# Patient Record
Sex: Male | Born: 1950 | Race: White | Hispanic: No | Marital: Married | Smoking: Former smoker
Health system: Southern US, Community
[De-identification: ages and names within clinical notes are randomized; demographics above are authoritative.]

## PROBLEM LIST (undated history)

## (undated) DIAGNOSIS — E785 Hyperlipidemia, unspecified: Secondary | ICD-10-CM

## (undated) DIAGNOSIS — I10 Essential (primary) hypertension: Secondary | ICD-10-CM

## (undated) DIAGNOSIS — I700 Atherosclerosis of aorta: Secondary | ICD-10-CM

## (undated) DIAGNOSIS — D126 Benign neoplasm of colon, unspecified: Secondary | ICD-10-CM

## (undated) DIAGNOSIS — R011 Cardiac murmur, unspecified: Secondary | ICD-10-CM

## (undated) DIAGNOSIS — F419 Anxiety disorder, unspecified: Secondary | ICD-10-CM

## (undated) DIAGNOSIS — Z860101 Personal history of adenomatous and serrated colon polyps: Secondary | ICD-10-CM

## (undated) DIAGNOSIS — E782 Mixed hyperlipidemia: Secondary | ICD-10-CM

## (undated) DIAGNOSIS — N200 Calculus of kidney: Secondary | ICD-10-CM

## (undated) DIAGNOSIS — F411 Generalized anxiety disorder: Secondary | ICD-10-CM

## (undated) DIAGNOSIS — I341 Nonrheumatic mitral (valve) prolapse: Secondary | ICD-10-CM

## (undated) DIAGNOSIS — M5126 Other intervertebral disc displacement, lumbar region: Secondary | ICD-10-CM

## (undated) DIAGNOSIS — K648 Other hemorrhoids: Secondary | ICD-10-CM

## (undated) DIAGNOSIS — M1990 Unspecified osteoarthritis, unspecified site: Secondary | ICD-10-CM

## (undated) DIAGNOSIS — I480 Paroxysmal atrial fibrillation: Secondary | ICD-10-CM

## (undated) DIAGNOSIS — K219 Gastro-esophageal reflux disease without esophagitis: Secondary | ICD-10-CM

## (undated) DIAGNOSIS — I4891 Unspecified atrial fibrillation: Secondary | ICD-10-CM

## (undated) DIAGNOSIS — Z86010 Personal history of colonic polyps: Secondary | ICD-10-CM

## (undated) HISTORY — PX: KNEE SURGERY: SHX244

## (undated) HISTORY — DX: Other hemorrhoids: K64.8

## (undated) HISTORY — DX: Cardiac murmur, unspecified: R01.1

## (undated) HISTORY — DX: Atherosclerosis of aorta: I70.0

## (undated) HISTORY — DX: Hyperlipidemia, unspecified: E78.5

## (undated) HISTORY — PX: COLONOSCOPY: SHX174

## (undated) HISTORY — DX: Gastro-esophageal reflux disease without esophagitis: K21.9

## (undated) HISTORY — DX: Anxiety disorder, unspecified: F41.9

## (undated) HISTORY — PX: CARDIAC CATHETERIZATION: SHX172

## (undated) HISTORY — DX: Nonrheumatic mitral (valve) prolapse: I34.1

## (undated) HISTORY — DX: Benign neoplasm of colon, unspecified: D12.6

---

## 1984-01-27 HISTORY — PX: KNEE ARTHROSCOPY: SUR90

## 1997-09-14 ENCOUNTER — Ambulatory Visit (HOSPITAL_COMMUNITY): Admission: RE | Admit: 1997-09-14 | Discharge: 1997-09-14 | Payer: Self-pay | Admitting: Cardiology

## 1998-01-26 HISTORY — PX: CARDIAC CATHETERIZATION: SHX172

## 1998-06-20 ENCOUNTER — Ambulatory Visit (HOSPITAL_COMMUNITY): Admission: RE | Admit: 1998-06-20 | Discharge: 1998-06-20 | Payer: Self-pay | Admitting: Neurology

## 1998-06-20 ENCOUNTER — Encounter: Payer: Self-pay | Admitting: Neurology

## 1999-12-17 ENCOUNTER — Ambulatory Visit (HOSPITAL_COMMUNITY): Admission: RE | Admit: 1999-12-17 | Discharge: 1999-12-17 | Payer: Self-pay | Admitting: Gastroenterology

## 1999-12-17 ENCOUNTER — Encounter: Payer: Self-pay | Admitting: Gastroenterology

## 2000-01-27 HISTORY — PX: CHOLECYSTECTOMY, LAPAROSCOPIC: SHX56

## 2000-02-11 ENCOUNTER — Ambulatory Visit (HOSPITAL_COMMUNITY): Admission: RE | Admit: 2000-02-11 | Discharge: 2000-02-11 | Payer: Self-pay | Admitting: Gastroenterology

## 2000-10-27 ENCOUNTER — Ambulatory Visit (HOSPITAL_COMMUNITY): Admission: RE | Admit: 2000-10-27 | Discharge: 2000-10-27 | Payer: Self-pay | Admitting: Gastroenterology

## 2000-10-27 ENCOUNTER — Encounter: Payer: Self-pay | Admitting: Gastroenterology

## 2000-12-14 ENCOUNTER — Encounter (INDEPENDENT_AMBULATORY_CARE_PROVIDER_SITE_OTHER): Payer: Self-pay

## 2000-12-14 ENCOUNTER — Observation Stay (HOSPITAL_COMMUNITY): Admission: RE | Admit: 2000-12-14 | Discharge: 2000-12-15 | Payer: Self-pay | Admitting: General Surgery

## 2000-12-14 HISTORY — PX: CHOLECYSTECTOMY, LAPAROSCOPIC: SHX56

## 2006-01-14 ENCOUNTER — Ambulatory Visit (HOSPITAL_BASED_OUTPATIENT_CLINIC_OR_DEPARTMENT_OTHER): Admission: RE | Admit: 2006-01-14 | Discharge: 2006-01-15 | Payer: Self-pay | Admitting: Orthopedic Surgery

## 2006-01-14 HISTORY — PX: KNEE ARTHROSCOPY W/ ACL RECONSTRUCTION: SHX1858

## 2006-01-26 HISTORY — PX: KNEE SURGERY: SHX244

## 2008-09-26 ENCOUNTER — Ambulatory Visit (HOSPITAL_BASED_OUTPATIENT_CLINIC_OR_DEPARTMENT_OTHER): Admission: RE | Admit: 2008-09-26 | Discharge: 2008-09-26 | Payer: Self-pay | Admitting: Orthopedic Surgery

## 2008-09-26 HISTORY — PX: KNEE ARTHROSCOPY W/ MENISCECTOMY: SHX1879

## 2010-05-02 LAB — POCT I-STAT 4, (NA,K, GLUC, HGB,HCT)
Glucose, Bld: 119 mg/dL — ABNORMAL HIGH (ref 70–99)
HCT: 49 % — ABNORMAL HIGH (ref 36.0–46.0)
Hemoglobin: 16.7 g/dL — ABNORMAL HIGH (ref 12.0–15.0)
Potassium: 4.5 mEq/L (ref 3.5–5.1)
Sodium: 139 mEq/L (ref 135–145)

## 2010-06-10 NOTE — Op Note (Signed)
NAME:  [name], [name] NO.:  [id number]   MEDICAL RECORD NO.:  [id number]          PATIENT TYPE:  AMB   LOCATION:  [facility]                         FACILITY:  [facility]   PHYSICIAN:  Ollen Gross, M.D.    DATE OF BIRTH:  1950-05-19   DATE OF PROCEDURE:  09/26/2008  DATE OF DISCHARGE:                               OPERATIVE REPORT   PREOPERATIVE DIAGNOSIS:  Right knee medial meniscal tear.   POSTOPERATIVE DIAGNOSIS:  Right knee medial meniscal tear.   PROCEDURE:  Right knee arthroscopy with meniscal debridement.   SURGEON:  Aluisio.   No assistant.   ANESTHESIA:  General.   ESTIMATED BLOOD LOSS:  Minimal.   DRAINS:  None.   COMPLICATIONS:  None.   CONDITION:  [name] to recovery.   Phillip [name] is a 60 year old male who sustained an injury to his right knee a  few months ago.  Exam and history suggested a medial meniscal tear,  confirmed by MRI.  He has had progressively worsening pain and  mechanical symptoms and presents now for arthroscopic debridement.   PROCEDURE IN DETAIL:  After successful administration of general  anesthetic, a tourniquet is placed on the right thigh, and right lower  extremity prepped and draped in the usual sterile fashion.  Superomedial  and inferolateral incisions are made, inflow cannula passed superomedial  and camera passed inferolateral.  Arthroscopic visualization proceeds.  Undersurface of the patella and trochlea looked normal.  Medial and  lateral gutters were visualized.  There were no loose bodies.  Flexion  and valgus force was applied to the knee, and the medial compartment is  entered.  He has a really bad tear in the body and posterior horn of the  medial meniscus, degenerative in nature.  Spinal needle is used to  localize the inferomedial portal.  Small incision made and dilator  placed.  The meniscus is debrided back to a [name] base with baskets and  a 4.2-mm shaver.  It is found to be [name].  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  The tibial plateau  looks normal with the exception of a small area of grade 2.  Intercondylar notch was visualized, ACL was normal.  Lateral compartment  is entered, and it looks normal.  The joint is again inspected.  There  no other tears or loose bodies noted.  Arthroscopic equipment  was  removed from the  inferior portals, which were closed with interrupted 4-0 nylon.  Then 20  mL of 4% Marcaine with epi injected through the inflow cannula.  Then  that is removed and that portal closed with nylon.  A bulky sterile  dressing is applied.  He is awakened and transported to recovery in  [name] condition.      Ollen Gross, M.D.  Electronically Signed     FA/MEDQ  D:  09/26/2008  T:  09/26/2008  Job:  [id number]

## 2010-06-13 NOTE — Op Note (Signed)
NAME:  Phillip [name], RAPPAPORT NO.:  [id number]   MEDICAL RECORD NO.:  [id number]          PATIENT TYPE:  AMB   LOCATION:  [facility]                          FACILITY:  [facility]   PHYSICIAN:  Loreta Ave, M.D. 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   PROCEDURE:  1. Left knee exam under anesthesia, arthroscopy, arthroscopic      endoscopic ACL reconstruction patellar tendon allograft, bone-      tendon-bone.  2. Bioabsorbable screw fixation above and below.  3. An Arthrex 6.5 mm metallic screw used as a post distally to augment      fixation.  4. Partial medial meniscectomy.  5. Chondroplasty of the patellofemoral joint.  6. Debridement of adhesions.   SURGEON:  Mckinley Jewel, MD.   ASSISTANT:  Zonia Kief, PA, present throughout the entire case.   ANESTHESIA:  General.   ESTIMATED BLOOD LOSS:  Minimal.   TOURNIQUET TIME:  One hour 15 minutes.   SPECIMENS:  None.   CULTURES:  None.   COMPLICATIONS:  None.   DRESSING:  Sterile compression with knee immobilizer.   PROCEDURE:  The patient was brought to the operating room.  After  adequate anesthesia had been obtained, tourniquet applied to the upper  aspect of the left leg.  Prepped and draped in the usual sterile  fashion.  Examined with positive Lachman and drawer.  Pivot slip.  Other  leg was stable.  After being exsanguinated with elevation, Esmarch  tourniquet inflated to 300 mmHg.  Three portals were created, one  superolateral, one each medial and lateral parapatellar.  Inflow  catheter introduced.  Using standard arthroscopy the knee was inspected.  Grade 2 and 3 chondromalacia of the patella debrided.  Good tracking.  Grade 3 throughout the trochlea, even some 4 in the medial aspect, which  had an overlying adhesion.  Chondroplasty to a stable surface.  The  adhesions debrided.  The medial compartment with no degenerative  changes, but marked tearing; complex __________  medial meniscus.  Taken  down to the rim  __________  meniscus.  Lateral meniscus and lateral  compartment looked good.  ACL and what was left of it was completely  nonfunctional.  Chronic deficiency.  Debrided.  PCL looked good.  Notch  opened with notchplasty.   Graft prepared for 10 mm tunnels above and below with a patellar tendon  allograft.  Tibial tunnel created with an incision next to the tubercle.  K wire driven and over drilled with a 10-mm reamer, exiting out through  the footprint on the tibia.  Femoral guide was inserted on the back  cortex of femur.  K wire driven, over drilled with a 10-mm reamer.  Both  tunnels assessed and found to be in good position.  Debris cleared  throughout.  Two-pin passer inserted across the knee and out through a  stab wound anterolateral thigh.  A 9.0 wire brought in the medial portal  and out through the femoral tunnel.  Graft attached to two-pin passer,  pulled in across the knee, seating the pegs well in the tibia and femur.  Both ends of the graft were then firmly attached with bioabsorbable 9 x  25 screws placed over 9.0 wire with appropriate tensioning technique  with the knee in 70 degrees of flexion.  Initially excellent counter  fixation, but on testing I had a little slip in the tibial tunnel.  This  was retensioned to the screw, retightened, but I augmented it with a 6.5  Arthrex screw distally in the tibia with the suture side over that.  When that was added, excellent  stability in all testing.  There was good  clearance of the graft through full motion.  Negative Lachman and  drawer.  Wounds were all irrigated and closed with Vicryl.  Portals were  closed with nylon.  Knee injected with Marcaine.  Sterile compressive  dressing applied.  Tourniquet deflated and removed.  Knee immobilizer  applied.  Anesthesia reversed and brought to recovery room.  Tolerated  surgery well with no complications.      Loreta Ave, M.D.  Electronically Signed     DFM/MEDQ  D:  01/14/2006  T:  01/15/2006  Job:  [id number]

## 2010-06-13 NOTE — Op Note (Signed)
[facility]  Patient:    Phillip [name], Phillip [name] Visit Number: [id number] MRN: [id number]          Service Type: SUR Location: 4W 0449 01 Attending Physician:  Arlis Porta Dictated by:   Adolph Pollack, M.D. Proc. Date: 12/14/00 Admit Date:  12/14/2000   CC:         Pearla Dubonnet, M.D.  Anselmo Rod, M.D.   Operative Report  PREOPERATIVE DIAGNOSIS:  Gallbladder polyposis.  POSTOPERATIVE DIAGNOSIS:  Gallbladder polyposis.  OPERATION/PROCEDURE:  Laparoscopic cholecystectomy.  SURGEON:  Adolph Pollack, M.D.  ASSISTANT:  Anselm Pancoast. Zachery Dakins, M.D.  ANESTHESIA:  General  INDICATIONS:  This is a 60 year old man who had some abdominal pain and underwent an ultrasound in the past and was noted to have some gallbladder polyps.  I had seen him in the past and recommended follow up ultrasound which was done.  The largest polyp initially measured 6.7 x 4.9 mm in size. However, with this latest ultrasound the polyp had grown to 8.4 mm in size. Because of its growth,  I recommended removal and now he presents for that. The procedures and risks were explained to him preoperatively.  TECHNIQUE:  He was placed supine on the operating table and a general anesthetic was administered.  The abdomen was sterilely prepped and draped. Local anesthetic was infiltrated in the subumbilical region and a subumbilical incision was made and carried down to the fascia where a 1.5 cm incision was made in the fascia.  The peritoneal cavity was entered sharply and under direct vision.  A purse-string suture of #0 Vicryl was placed around the fascial edges.  A Hasson trocar was introduced into the peritoneal cavity and a pneumoperitoneum was created by insufflation of CO2 gas.  Next, he was placed in the appropriate position and the 11-mm trocar was placed through an epigastric incision and two 5-mm trocars were placed in the right midabdominal  incisions. The gallbladder was grasped.  It did not appear to be firm, indurated, or densely adherent to the liver bed.  Using blunt dissection staying on the gallbladder I gently identified the cystic duct and its junction with the gallbladder.  I mobilized the infundibulum and created a window around the cystic duct.  I then clipped it three times proximally and once distally and divided it.  I subsequently identified a posterior and anterior branch of the cystic artery close to the gallbladder.  I clipped these and divided them.  I then dissected the gallbladder free from the liver bed using the cautery.  The gallbladder was easy to dissect free from the liver bed with no dense attachments.  I subsequently irrigated the gallbladder fossa and noted no bleeding or bile leakage.  I did this twice.  I then removed the gallbladder through the subumbilical port.  Further irrigation and drainage was done of the irrigant and no bleeding or bile leakage was noted.  Under direct vision, I closed the subumbilical fascial defect by tightening up it and tying down with pursestring suture.  I then removed the rest of the trocars and released the pneumoperitoneum.  The skin incisions were closed with 4-0 Monocryl subcuticular stitches.  Steri-Strips and sterile dressings were applied.  He tolerated the procedure well without any apparent complications and was taken to recovery room in satisfactory condition. Dictated by:   Adolph Pollack, M.D. Attending Physician:  Arlis Porta DD:  12/14/00 TD:  12/14/00 Job: [id number]

## 2010-06-13 NOTE — Procedures (Signed)
[place]  Patient:    Phillip [name], MONCUS                 MRN: [id number] Proc. Date: 02/11/00 Adm. Date:  [id number] Attending:  Charna Elizabeth CC:         Feliciana Rossetti, M.D.  Adolph Pollack, M.D.   Procedure Report  DATE OF BIRTH:  [birth year].  PROCEDURE:  Colonoscopy.  ENDOSCOPIST:  Anselmo Rod, M.D.  INSTRUMENT USED:  Olympus video colonoscope.  INDICATION FOR PROCEDURE:  Rectal bleeding and family history of colon cancer in a 60 year old white male.  Rule out colonic polyps, masses, hemorrhoids, etc.  PREPROCEDURE PREPARATION:  Informed consent was procured from the patient. The patient was fasted for eight hours prior to the procedure and prepped with a bottle of magnesium citrate and a gallon of NuLytely the night prior to the procedure.  PREPROCEDURE PHYSICAL:  VITAL SIGNS:  The patient had stable vital signs.  NECK:  Supple.  CHEST:  Clear to auscultation.  S1, S2 regular.  ABDOMEN:  Soft.  Some epigastric tenderness on palpation.  No guarding, no rebound, no rigidity, no hepatosplenomegaly.  Denies _____.  DESCRIPTION OF PROCEDURE:  The patient was placed in the left lateral decubitus position and sedated with an additional 20 mg of Demerol and 2 mg of Versed intravenously.  The patient had received 80 mg of Demerol and 8 mg of Versed for the upper endoscopy.  Once the patient was adequately sedate and maintained on low-flow oxygen and continuous cardiac monitoring, the Olympus video colonoscope was advanced from the rectum to the cecum with slight difficulty secondary to some solid stool in the right colon.  Visualization, however, was adequate, and multiple washes were used.  There were small, nonbleeding internal hemorrhoids seen on retroflexion in the rectum.  The procedure was complete to the cecum and terminal ileum.  The ileocecal valve and the appendiceal orifice were clearly visualized and then  photographed. The terminal ileum appeared healthy and without lesions.  No large masses, polyps, erosions, ulcerations, or diverticula were seen.  IMPRESSION: 1. Essentially a normal, healthy-appearing colon except for small, nonbleeding    internal hemorrhoids. 2. Normal-appearing terminal ileum .  RECOMMENDATIONS: 1. Increase fluid and fiber in the diet. 2. Repeat colorectal cancer screening in the next five years unless the    patient develops any abnormal symptoms in the interim. DD:  02/11/00 TD:  02/11/00 Job: [id number]

## 2010-06-13 NOTE — Procedures (Signed)
[place]  Patient:    Phillip [name], Phillip [name]                 MRN: [id number] Proc. Date: 02/11/00 Adm. Date:  [id number] Attending:  Charna Elizabeth CC:         Feliciana Rossetti, M.D.  Adolph Pollack, M.D.   Procedure Report  DATE OF BIRTH:  [birth year].  PROCEDURE:  Esophagogastroduodenoscopy with biopsies.  ENDOSCOPIST:  Anselmo Rod, M.D.  INSTRUMENT USED:  Olympus video panendoscope.  INDICATION FOR PROCEDURE:  Epigastric pain and severe reflux in a 60 year old white male.  Rule out peptic ulcer disease, esophagitis, gastritis, etc.  PREPROCEDURE PREPARATION:  Informed consent was procured from the patient. The patient was fasted for eight hours prior to the procedure.  PREPROCEDURE PHYSICAL:  VITAL SIGNS:  The patient had stable vital signs.  NECK:  Supple.  CHEST:  Clear to auscultation.  S1, S2 regular.  ABDOMEN:  Soft with normal abdominal bowel sounds.  Epigastric tenderness on palpation with some guarding, no rebound or rigidity.  DESCRIPTION OF PROCEDURE:  The patient was placed in the left lateral decubitus position and sedated with 80 mg of Demerol and 8 mg of Versed intravenously.  Once the patient was adequately sedate and maintained on low-flow oxygen and continuous cardiac monitoring, the Olympus video panendoscope was advanced through the mouthpiece, over the tongue, into the esophagus under direct vision.  Except for a small patch of pinkish mucosa above the Z-line, no other abnormality was seen in the esophagus.  No erosions, ulcerations, stricture, masses, or rings were seen.  The small pinkish patch of mucosa above the Z-line was biopsied for Barretts.  The rest of the gastric mucosa and the proximal small bowel appeared normal.  There were no ulcers, masses, or polyps seen.  IMPRESSION: 1. Small patch of pinkish mucosa above Z-line, biopsied to rule out Barretts. 2. Otherwise normal  EGD.  RECOMMENDATIONS: 1. Await pathology results. 2. Outpatient follow-up in the next two weeks. DD:  02/11/00 TD:  02/11/00 Job: [id number]

## 2011-04-28 DIAGNOSIS — E785 Hyperlipidemia, unspecified: Secondary | ICD-10-CM | POA: Insufficient documentation

## 2011-06-01 DIAGNOSIS — I10 Essential (primary) hypertension: Secondary | ICD-10-CM | POA: Insufficient documentation

## 2013-06-12 ENCOUNTER — Ambulatory Visit (HOSPITAL_COMMUNITY): Payer: BC Managed Care – PPO | Attending: Cardiovascular Disease | Admitting: Radiology

## 2013-06-12 VITALS — BP 140/86 | HR 57 | Ht 69.0 in | Wt 194.0 lb

## 2013-06-12 DIAGNOSIS — R0602 Shortness of breath: Secondary | ICD-10-CM

## 2013-06-12 DIAGNOSIS — R079 Chest pain, unspecified: Secondary | ICD-10-CM | POA: Insufficient documentation

## 2013-06-12 MED ORDER — TECHNETIUM TC 99M SESTAMIBI GENERIC - CARDIOLITE
30.0000 | Freq: Once | INTRAVENOUS | Status: AC | PRN
  Administered 2013-06-12: 30 via INTRAVENOUS

## 2013-06-12 MED ORDER — TECHNETIUM TC 99M SESTAMIBI GENERIC - CARDIOLITE
10.0000 | Freq: Once | INTRAVENOUS | Status: AC | PRN
  Administered 2013-06-12: 10 via INTRAVENOUS

## 2013-06-12 NOTE — Progress Notes (Signed)
[facility] [place], [place] [address] [phone]    Cardiology Nuclear Med Study  Phillip [name] is a 63 y.o. male     MRN : [id number]     DOB: April 24, 1950  Procedure Date: 06/12/2013  Nuclear Med Background Indication for Stress Test:  Evaluation for Ischemia History:  Nml Cath~76yrs ago, MPS 15 yrs ago Cardiac Risk Factors: Family History - CAD, Hypertension and Lipids  Symptoms:  Chest Pain with Exertion (last date of chest discomfort 3 weeks ago), DOE, Palpitations and SOB   Nuclear Pre-Procedure Caffeine/Decaff Intake:  8:30am decaffeinated coffee NPO After: 8:00pm   Lungs:  clear O2 Sat: 98% on room air. IV 0.9% NS with Angio Cath:  22g  IV Site: R Hand  IV Started by:  Annye Rusk, CNMT  Chest Size (in):  42 Cup Size: n/a  Height: 5\' 9"  (1.753 m)  Weight:  194 lb (87.998 kg)  BMI:  Body mass index is 28.64 kg/(m^2). Tech Comments:  Held atenolol 24hrs    Nuclear Med Study 1 or 2 day study: 1 day  Stress Test Type:  Stress  Reading MD: N/A  Order Authorizing Provider:  Josetta Huddle, MD  Resting Radionuclide: Technetium 31m Sestamibi  Resting Radionuclide Dose: 11.0 mCi   Stress Radionuclide:  Technetium 62m Sestamibi  Stress Radionuclide Dose: 33.0 mCi           Stress Protocol Rest HR: 57 Stress HR: 144  Rest BP: 140/86 Stress BP: 175/92  Exercise Time (min): 9:49 METS: 11.2   Predicted Max HR: 159 bpm % Max HR: 90.57 bpm Rate Pressure Product: 25200   Dose of Adenosine (mg):  n/a Dose of Lexiscan: n/a mg  Dose of Atropine (mg): n/a Dose of Dobutamine: n/a mcg/kg/min (at max HR)  Stress Test Technologist: Irven Baltimore, RN  Nuclear Technologist:  Annye Rusk, CNMT     Rest Procedure:  Myocardial perfusion imaging was performed at rest 45 minutes following the intravenous administration of Technetium 44m Sestamibi. Rest ECG: Normal sinus rhythm. Normal EKG.  Stress Procedure:  The patient exercised on the  treadmill utilizing the Bruce Protocol for 9:49 minutes, RPE=17.  The patient stopped due to marked DOE and Chest tightness 4/10.  Technetium 11m Sestamibi was injected at peak exercise and myocardial perfusion imaging was performed after a brief delay. Stress ECG: No significant change from baseline ECG  QPS Raw Data Images:  Patient motion noted; appropriate software correction applied. Stress Images:  Normal homogeneous uptake in all areas of the myocardium. Rest Images:  Normal homogeneous uptake in all areas of the myocardium. Subtraction (SDS):  No evidence of ischemia. Transient Ischemic Dilatation (Normal <1.22):  0.95 Lung/Heart Ratio (Normal <0.45):  0.28  Quantitative Gated Spect Images QGS EDV:  113 ml QGS ESV:  44 ml  Impression Exercise Capacity:  Good exercise capacity. BP Response:  Normal blood pressure response. Clinical Symptoms:  The patient had 4/10 chest tightness at peak stress ECG Impression:  No significant ST segment change suggestive of ischemia. Comparison with Prior Nuclear Study: No images to compare  Overall Impression:  Normal stress nuclear study.  there is no scar or ischemia. The patient had good exercise tolerance. This is a low risk scan.  LV Ejection Fraction: 61%.  LV Wall Motion:  Normal Wall Motion.  Carlena Bjornstad, MD

## 2014-02-15 ENCOUNTER — Emergency Department (HOSPITAL_COMMUNITY)
Admission: EM | Admit: 2014-02-15 | Discharge: 2014-02-16 | Disposition: A | Discharge status: Home / Self Care | Payer: BLUE CROSS/BLUE SHIELD | Attending: Emergency Medicine | Admitting: Emergency Medicine

## 2014-02-15 ENCOUNTER — Encounter (HOSPITAL_COMMUNITY): Payer: Self-pay

## 2014-02-15 DIAGNOSIS — K5900 Constipation, unspecified: Secondary | ICD-10-CM | POA: Diagnosis not present

## 2014-02-15 DIAGNOSIS — Z7951 Long term (current) use of inhaled steroids: Secondary | ICD-10-CM | POA: Insufficient documentation

## 2014-02-15 DIAGNOSIS — I10 Essential (primary) hypertension: Secondary | ICD-10-CM | POA: Diagnosis not present

## 2014-02-15 DIAGNOSIS — Z7982 Long term (current) use of aspirin: Secondary | ICD-10-CM | POA: Diagnosis not present

## 2014-02-15 DIAGNOSIS — R339 Retention of urine, unspecified: Secondary | ICD-10-CM | POA: Diagnosis not present

## 2014-02-15 DIAGNOSIS — Z79899 Other long term (current) drug therapy: Secondary | ICD-10-CM | POA: Diagnosis not present

## 2014-02-15 HISTORY — DX: Essential (primary) hypertension: I10

## 2014-02-15 LAB — URINALYSIS, ROUTINE W REFLEX MICROSCOPIC
Bilirubin Urine: NEGATIVE
GLUCOSE, UA: NEGATIVE mg/dL
KETONES UR: NEGATIVE mg/dL
LEUKOCYTES UA: NEGATIVE
Nitrite: NEGATIVE
Protein, ur: NEGATIVE mg/dL
SPECIFIC GRAVITY, URINE: 1.014 (ref 1.005–1.030)
Urobilinogen, UA: 0.2 mg/dL (ref 0.0–1.0)
pH: 5.5 (ref 5.0–8.0)

## 2014-02-15 LAB — URINE MICROSCOPIC-ADD ON

## 2014-02-15 NOTE — ED Notes (Signed)
Pt also complains of urinary retention, he states he feels like his bladder isn't all the way emptied, he also did two enemas with no relief

## 2014-02-15 NOTE — ED Notes (Signed)
Pt complains of constipation and urinary retention, he has tried to disimpact himself without relief

## 2014-02-16 MED ORDER — LIDOCAINE HCL 2 % EX GEL
1.0000 "application " | Freq: Once | CUTANEOUS | Status: AC
  Administered 2014-02-16: 1 via URETHRAL
  Filled 2014-02-16: qty 10

## 2014-02-16 MED ORDER — LORAZEPAM 1 MG PO TABS
1.0000 mg | ORAL_TABLET | Freq: Once | ORAL | Status: AC
  Administered 2014-02-16: 1 mg via ORAL
  Filled 2014-02-16: qty 1

## 2014-02-16 NOTE — ED Notes (Addendum)
Attempted both a 16 french foley and a 16 french coude catheter. 1st attempt was with Kelly Garcia, RN and Brittany Duncan, RN and second attempt was with Chan Sok, NT and Kelly Garcia, RN. Resistance met with both attempts at insertion and unable to place foley/coude catheter at this time. Pt states that he feels like he needs to void and is assisted to the bathroom with urinal at this time. PVR via bladder scanner will be performed if pt is able to void. MD made aware of situation. 

## 2014-02-16 NOTE — Discharge Instructions (Signed)
Constipation °Constipation is when a person has fewer than three bowel movements a week, has difficulty having a bowel movement, or has stools that are dry, hard, or larger than normal. As people grow older, constipation is more common. If you try to fix constipation with medicines that make you have a bowel movement (laxatives), the problem may get worse. Long-term laxative use may cause the muscles of the colon to become weak. A low-fiber diet, not taking in enough fluids, and taking certain medicines may make constipation worse.  °CAUSES  °· Certain medicines, such as antidepressants, pain medicine, iron supplements, antacids, and water pills.   °· Certain diseases, such as diabetes, irritable bowel syndrome (IBS), thyroid disease, or depression.   °· Not drinking enough water.   °· Not eating enough fiber-rich foods.   °· Stress or travel.   °· Lack of physical activity or exercise.   °· Ignoring the urge to have a bowel movement.   °· Using laxatives too much.   °SIGNS AND SYMPTOMS  °· Having fewer than three bowel movements a week.   °· Straining to have a bowel movement.   °· Having stools that are hard, dry, or larger than normal.   °· Feeling full or bloated.   °· Pain in the lower abdomen.   °· Not feeling relief after having a bowel movement.   °DIAGNOSIS  °Your health care provider will take a medical history and perform a physical exam. Further testing may be done for severe constipation. Some tests may include: °· A barium enema X-ray to examine your rectum, colon, and, sometimes, your small intestine.   °· A sigmoidoscopy to examine your lower colon.   °· A colonoscopy to examine your entire colon. °TREATMENT  °Treatment will depend on the severity of your constipation and what is causing it. Some dietary treatments include drinking more fluids and eating more fiber-rich foods. Lifestyle treatments may include regular exercise. If these diet and lifestyle recommendations do not help, your health care  provider may recommend taking over-the-counter laxative medicines to help you have bowel movements. Prescription medicines may be prescribed if over-the-counter medicines do not work.  °HOME CARE INSTRUCTIONS  °· Eat foods that have a lot of fiber, such as fruits, vegetables, whole grains, and beans. °· Limit foods high in fat and processed sugars, such as french fries, hamburgers, cookies, candies, and soda.   °· A fiber supplement may be added to your diet if you cannot get enough fiber from foods.   °· Drink enough fluids to keep your urine clear or pale yellow.   °· Exercise regularly or as directed by your health care provider.   °· Go to the restroom when you have the urge to go. Do not hold it.   °· Only take over-the-counter or prescription medicines as directed by your health care provider. Do not take other medicines for constipation without talking to your health care provider first.   °SEEK IMMEDIATE MEDICAL CARE IF:  °· You have bright red blood in your stool.   °· Your constipation lasts for more than 4 days or gets worse.   °· You have abdominal or rectal pain.   °· You have thin, pencil-like stools.   °· You have unexplained weight loss. °MAKE SURE YOU:  °· Understand these instructions. °· Will watch your condition. °· Will get help right away if you are not doing well or get worse. °Document Released: 10/11/2003 Document Revised: 01/17/2013 Document Reviewed: 10/24/2012 °[employer]® Patient Information ©2015 [employer]. This information is not intended to replace advice given to you by your health care provider. Make sure you discuss any questions   you have with your health care provider.  Acute Urinary Retention Acute urinary retention is the temporary inability to urinate. This is a common problem in older men. As men age their prostates become larger and block the flow of urine from the bladder. This is usually a problem that has come on gradually.  HOME CARE INSTRUCTIONS If you are sent  home with a Foley catheter and a drainage system, you will need to discuss the best course of action with your health care provider. While the catheter is in, maintain a good intake of fluids. Keep the drainage bag emptied and lower than your catheter. This is so that contaminated urine will not flow back into your bladder, which could lead to a urinary tract infection. There are two main types of drainage bags. One is a large bag that usually is used at night. It has a good capacity that will allow you to sleep through the night without having to empty it. The second type is called a leg bag. It has a smaller capacity, so it needs to be emptied more frequently. However, the main advantage is that it can be attached by a leg strap and can go underneath your clothing, allowing you the freedom to move about or leave your home. Only take over-the-counter or prescription medicines for pain, discomfort, or fever as directed by your health care provider.  SEEK MEDICAL CARE IF:  You develop a low-grade fever.  You experience spasms or leakage of urine with the spasms. SEEK IMMEDIATE MEDICAL CARE IF:   You develop chills or fever.  Your catheter stops draining urine.  Your catheter falls out.  You start to develop increased bleeding that does not respond to rest and increased fluid intake. MAKE SURE YOU:  Understand these instructions.  Will watch your condition.  Will get help right away if you are not doing well or get worse. Document Released: 04/20/2000 Document Revised: 01/17/2013 Document Reviewed: 06/23/2012 [facility] Patient Information 2015 [place], [place]. This information is not intended to replace advice given to you by your health care provider. Make sure you discuss any questions you have with your health care provider.

## 2014-02-16 NOTE — ED Notes (Addendum)
Pt alert, oriented, and ambulatory upon DC. He was advised to follow up with PCP in 1 week.   E signature not obtained due to signature pad malfunction.

## 2014-02-16 NOTE — ED Provider Notes (Signed)
CSN: [id number]     Arrival date & time 02/15/14  2223 History   First MD Initiated Contact with Patient 02/15/14 2316     Chief Complaint  Patient presents with  . Constipation     (Consider location/radiation/quality/duration/timing/severity/associated sxs/prior Treatment) Patient is a 64 y.o. male presenting with constipation.  Constipation Severity:  Moderate Time since last bowel movement:  8 hours Timing:  Constant Progression:  Unchanged Chronicity:  New Context: medication (taking several cold and cough medications including tussionex, zyrtec, prednisone. )   Relieved by:  Nothing Ineffective treatments:  Enemas Associated symptoms: abdominal pain (suprapubic and rectal) and urinary retention   Associated symptoms: no fever, no nausea and no vomiting   Associated symptoms comment:  URI symptoms x 5 days   Past Medical History  Diagnosis Date  . Hypertension    History reviewed. No pertinent past surgical history. History reviewed. No pertinent family history. History  Substance Use Topics  . Smoking status: Never Smoker   . Smokeless tobacco: Not on file  . Alcohol Use: No    Review of Systems  Constitutional: Negative for fever.  Gastrointestinal: Positive for abdominal pain (suprapubic and rectal) and constipation. Negative for nausea and vomiting.  All other systems reviewed and are negative.     Allergies  Review of patient's allergies indicates no known allergies.  Home Medications   Prior to Admission medications   Medication Sig Start Date End Date Taking? Authorizing Provider  aspirin 81 MG tablet Take 81 mg by mouth daily.   Yes Historical Provider, MD  cetirizine (ZYRTEC) 10 MG tablet Take 10 mg by mouth daily.   Yes Historical Provider, MD  chlorpheniramine-HYDROcodone (TUSSIONEX) 10-8 MG/5ML LQCR Take 5 mLs by mouth 2 (two) times daily.   Yes Historical Provider, MD  cholecalciferol (VITAMIN D) 1000 UNITS tablet Take 4,000 Units by mouth  daily.   Yes Historical Provider, MD  diazepam (VALIUM) 2 MG tablet Take 2 mg by mouth every 6 (six) hours as needed for anxiety.   Yes Historical Provider, MD  fluticasone (FLONASE) 50 MCG/ACT nasal spray Place 2 sprays into both nostrils daily.   Yes Historical Provider, MD  metoprolol succinate (TOPROL-XL) 25 MG 24 hr tablet Take 25 mg by mouth daily.   Yes Historical Provider, MD  omega-3 acid ethyl esters (LOVAZA) 1 G capsule Take 2 g by mouth 2 (two) times daily.   Yes Historical Provider, MD  oxymetazoline (AFRIN) 0.05 % nasal spray Place 1 spray into both nostrils 2 (two) times daily.   Yes Historical Provider, MD  predniSONE (DELTASONE) 20 MG tablet Take 20 mg by mouth daily with breakfast.   Yes Historical Provider, MD  rosuvastatin (CRESTOR) 5 MG tablet Take 5 mg by mouth daily.   Yes Historical Provider, MD  valsartan (DIOVAN) 80 MG tablet Take 80 mg by mouth daily.   Yes Historical Provider, MD   BP 139/81 mmHg  Pulse 83  Temp(Src) 98.6 F (37 C) (Oral)  Resp 20  Ht 5\' 9"  (1.753 m)  Wt 192 lb (87.091 kg)  BMI 28.34 kg/m2  SpO2 100% Physical Exam  Constitutional: He is oriented to person, place, and time. He appears well-developed and well-nourished. No distress.  HENT:  Head: Normocephalic and atraumatic.  Eyes: Conjunctivae are normal. No scleral icterus.  Neck: Neck supple.  Cardiovascular: Normal rate and intact distal pulses.   Pulmonary/Chest: Effort normal. No stridor. No respiratory distress.  Abdominal: Normal appearance. He exhibits no distension. There is tenderness  in the suprapubic area. There is no rigidity, no rebound and no guarding.  Genitourinary: Rectal exam shows no external hemorrhoid, no fissure, no mass (no fecal impaction) and anal tone normal.  Musculoskeletal:       Lumbar back: He exhibits no tenderness, no bony tenderness, no swelling and no edema.  Neurological: He is alert and oriented to person, place, and time.  Skin: Skin is warm and dry.  No rash noted.  Psychiatric: He has a normal mood and affect. His behavior is normal.  Nursing note and vitals reviewed.   ED Course  Procedures (including critical care time) Labs Review Labs Reviewed  URINALYSIS, ROUTINE W REFLEX MICROSCOPIC - Abnormal; Notable for the following:    Hgb urine dipstick TRACE (*)    All other components within normal limits  URINE MICROSCOPIC-ADD ON    Imaging Review No results found.   EKG Interpretation None      MDM   Final diagnoses:  Constipation, unspecified constipation type  Urinary retention    64 year old male presenting with chief complaint of constipation and also has noticed urinary retention. Initial post void bladder residual was over 500 mL. Unable to place Foley or coude catheters.  However, he was subsequently about to have a large bowel movement and was able to urinate more easily.  Second post void residual was about 300.  I suspect his symptoms are secondary to the medications he has been taking for his URI.  He has no back pain or decreased anal tone to suggest spinal pathology.  I suspect his urinary retention will continue to improve after stopping these medications, but I advised him to follow up closely with his PCP and come back to the ED if needed.    Houston Siren III, MD 02/16/14 [phone]

## 2015-09-24 ENCOUNTER — Other Ambulatory Visit: Payer: Self-pay | Admitting: Surgery

## 2015-09-24 DIAGNOSIS — R1900 Intra-abdominal and pelvic swelling, mass and lump, unspecified site: Secondary | ICD-10-CM

## 2015-09-26 ENCOUNTER — Ambulatory Visit
Admission: RE | Admit: 2015-09-26 | Discharge: 2015-09-26 | Disposition: A | Discharge status: Home / Self Care | Payer: 59 | Source: Ambulatory Visit | Attending: Surgery | Admitting: Surgery

## 2015-09-26 DIAGNOSIS — R1900 Intra-abdominal and pelvic swelling, mass and lump, unspecified site: Secondary | ICD-10-CM

## 2015-09-26 MED ORDER — IOPAMIDOL (ISOVUE-300) INJECTION 61%
100.0000 mL | Freq: Once | INTRAVENOUS | Status: AC | PRN
  Administered 2015-09-26: 100 mL via INTRAVENOUS

## 2017-05-28 ENCOUNTER — Ambulatory Visit: Payer: 59 | Admitting: Cardiovascular Disease

## 2017-05-28 ENCOUNTER — Encounter: Payer: Self-pay | Admitting: Cardiovascular Disease

## 2017-05-28 VITALS — BP 132/84 | HR 57 | Ht 69.0 in | Wt 190.6 lb

## 2017-05-28 DIAGNOSIS — I10 Essential (primary) hypertension: Secondary | ICD-10-CM | POA: Diagnosis not present

## 2017-05-28 DIAGNOSIS — E785 Hyperlipidemia, unspecified: Secondary | ICD-10-CM | POA: Diagnosis not present

## 2017-05-28 DIAGNOSIS — I700 Atherosclerosis of aorta: Secondary | ICD-10-CM

## 2017-05-28 DIAGNOSIS — R002 Palpitations: Secondary | ICD-10-CM

## 2017-05-28 DIAGNOSIS — I341 Nonrheumatic mitral (valve) prolapse: Secondary | ICD-10-CM

## 2017-05-28 MED ORDER — METOPROLOL TARTRATE 25 MG PO TABS: 25.0000 mg | ORAL_TABLET | Freq: Two times a day (BID) | ORAL | 3 refills | Status: DC

## 2017-05-28 NOTE — Patient Instructions (Addendum)
Medication Instructions: Dr Sallyanne Kuster has recommended making the following medication changes: 1. STOP Valsartan 2. OKAY TO INCREASE Metoprolol to 25 mg twice daily  Labwork: NONE ORDERED  Testing/Procedures: 1. Echocardiogram - Your physician has requested that you have an echocardiogram. Echocardiography is a painless test that uses sound waves to create images of your heart. It provides your doctor with information about the size and shape of your heart and how well your heart's chambers and valves are working. This procedure takes approximately one hour. There are no restrictions for this procedure. This will be performed at our [facility] location [place], [place] [address] [phone]  Follow-up: Dr Sallyanne Kuster recommends that you follow-up with him as needed.  If you need a refill on your cardiac medications before your next appointment, please call your pharmacy.

## 2017-05-28 NOTE — Progress Notes (Signed)
Cardiology Consultation Note:    Date:  05/30/2017   ID:  Phillip [name], DOB 28-Jun-1950, MRN [id number]  PCP:  Josetta Huddle, MD Cardiologist:   New (past patient of R. Rollene Fare and Domenic Moras)  Referring MD: Inda Merlin  Chief Complaint  Patient presents with  . Palpitations  . Mitral Valve Prolapse   Phillip [name] is a 67 y.o. male who is being seen today for the evaluation of palpitations and MVP at the request of Dr. Inda Merlin   History of Present Illness:    Phillip [name] is a 67 y.o. male with a hx of systemic hypertension, dyslipidemia (primarily low HDL cholesterol), mitral valve prolapse, history of aortic atherosclerosis by CT, presenting with recent onset increased frequency of palpitations.  History Phillip [name] is a very active 67 year old.  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  He walks 4 to 6 miles a day at least 5 days a week.  He is the member of the band who carries all the heavy equipment, without difficulty.  He denies any complaints of chest pain or shortness of breath during activity.  He denies syncope or dizziness.  He complains of palpitations that he notices only when he lies in bed at night.  Today have recently increased in frequency.  He has had multiple previous cardiac evaluations.  He reports having a cardiac catheterization with Dr. Janene Madeira at least 15 years ago (did not find record, may have been performed at an outpatient Cath Lab).  Since then he has had 3 Cardiolite studies which he reports were "all okay".  The most recent was in 2015, to size on the treadmill for almost 10 minutes, images showed normal perfusion and EF of 61%.  Echocardiograms have shown mitral valve prolapse, but without significant mitral regurgitation (none of those reports or images are available for review).  On abdominal CT performed for noncardiac symptoms he had calcium in the aorta, is no aneurysm.  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  Past Medical History:  Diagnosis Date  . Hyperlipidemia   . Hypertension     Past Surgical History:  Procedure Laterality Date  . CARDIAC CATHETERIZATION    . CHOLECYSTECTOMY, LAPAROSCOPIC  2002  . KNEE SURGERY  2007 and 2010    Current Medications: Current Meds  Medication Sig  . ALPRAZolam (XANAX) 0.25 MG tablet Take 0.25 mg by mouth at bedtime as needed for anxiety.  Marland Kitchen aspirin 81 MG tablet Take 81 mg by mouth daily.  . cholecalciferol (VITAMIN D) 1000 UNITS tablet Take 4,000 Units by mouth daily.  . fluticasone (FLONASE) 50 MCG/ACT nasal spray Place 2 sprays into both nostrils daily.  . metoprolol succinate (TOPROL-XL) 25 MG 24 hr tablet Take 25 mg by mouth daily.  Marland Kitchen omega-3 acid ethyl esters (LOVAZA) 1 G capsule Take 2 g by mouth 2 (two) times daily.  . rosuvastatin (CRESTOR) 5 MG tablet Take 5 mg by mouth daily.  . [DISCONTINUED] valsartan (DIOVAN) 80 MG tablet Take 80 mg by mouth daily.     Allergies:   Patient has no known allergies.   Social History   Socioeconomic History  . Marital status: Married    Spouse name: Not on file  . Number of children: Not on file  . Years of education: Not on file  . Highest education level: Not on file  Occupational History  . Not on file  Social Needs  . Financial resource strain:  Not on file  . Food insecurity:    Worry: Not on file    Inability: Not on file  . Transportation needs:    Medical: Not on file    Non-medical: Not on file  Tobacco Use  . Smoking status: Never Smoker  . Smokeless tobacco: Never Used  Substance and Sexual Activity  . Alcohol use: No  . Drug use: Never  . Sexual activity: Not on file  Lifestyle  . Physical activity:    Days per week: Not on file    Minutes per session: Not on file  . Stress: Not on file  Relationships  . Social connections:    Talks on phone: Not on file    Gets together: Not on file    Attends religious service: Not on file    Active member of club or  organization: Not on file    Attends meetings of clubs or organizations: Not on file    Relationship status: Not on file  Other Topics Concern  . Not on file  Social History Narrative  . Not on file     Family History: The patient's family history includes CAD in his father; Heart attack in his father; Pulmonary fibrosis in his mother.  ROS:   Please see the history of present illness.    All other systems reviewed and are negative.  EKGs/Labs/Other Studies Reviewed:    The following studies were reviewed today: Nuclear perfusion images 2015, CT 2017 Records from Dr. Inda Merlin  EKG:  EKG is  ordered today.  The ekg ordered today demonstrates normal sinus rhythm, normal tracing  Recent Labs: Labs from Dr. Inda Merlin March 19, 2017 Total cholesterol 104, triglycerides 89, HDL 27, LDL 59 Normal LFTs with borderline bilirubin 1.1, TSH 1.8, hemoglobin 15.8, glucose 106, creatinine 0.9, potassium 4.1 ECG from February 22 shows sinus bradycardia but is otherwise normal  Physical Exam:    VS:  BP 132/84 (BP Location: Right Arm)   Pulse (!) 57   Ht 5\' 9"  (1.753 m)   Wt 190 lb 9.6 oz (86.5 kg)   BMI 28.15 kg/m     Wt Readings from Last 3 Encounters:  05/28/17 190 lb 9.6 oz (86.5 kg)  02/15/14 192 lb (87.1 kg)  06/12/13 194 lb (88 kg)     GEN:  Well nourished, well developed in no acute distress, appears fit HEENT: Normal NECK: No JVD; No carotid bruits LYMPHATICS: No lymphadenopathy CARDIAC: RRR, no murmurs, rubs, gallops.  There is a distinct midsystolic click that becomes more prominent with Valsalva maneuver and disappears with handgrip.  There are no murmurs even with provocative maneuvers. RESPIRATORY:  Clear to auscultation without rales, wheezing or rhonchi  ABDOMEN: Soft, non-tender, non-distended MUSCULOSKELETAL:  No edema; No deformity  SKIN: Warm and dry NEUROLOGIC:  Alert and oriented x 3 PSYCHIATRIC:  Normal affect   ASSESSMENT:    1. Mitral valve prolapse     2. Aortic atherosclerosis (Phillip [name])   3. Dyslipidemia   4. Essential hypertension   5. Palpitation    PLAN:    In order of problems listed above:  1. MVP: His exam is highly compatible with mitral valve prolapse but I do not hear a murmur.  I cannot locate any previously performed echocardiogram suggesting the most recent one is probably more than 66 years old.  We will repeat his echo to confirm the diagnosis. 2. AO Atheroscl: Reinforces the importance to treat any vascular risk factors, particularly his dyslipidemia.  Asymptomatic.  Normal caliber aorta, does not require routine follow-up.  Excellent distal pulses. 3. HLP: We do not have a great treatment for his low HDL other than physical activity and maintaining a healthy weight.  He is already walking about 30 miles a week.  He is only mildly overweight but could try to lose a few pounds.  Consider switching from Lovaza to Vascepa.  Relatively recent treadmill nuclear stress test was normal. 4. HTN: Well-controlled.  We will stop his valsartan in order to allow higher dose of beta-blocker. 5. Palpitations:  was his presenting complaint, but it does not seem to be a serious problem, since there is little evidence for any structural heart disease.  I suspect that he is having PVCs, in turn possibly related to mitral valve prolapse.  He is already considering buying an Apple watch.  I suggested that he could send some transmissions from the device via [email or web address].  Try to increase his beta-blocker dose, but need to watch for symptoms of bradycardia.  Currently asymptomatic and his slow heart rate may be an expression of his frequent aerobic exercise and level of fitness.   Medication Adjustments/Labs and Tests Ordered: Current medicines are reviewed at length with the patient today.  Concerns regarding medicines are outlined above.  Orders Placed This Encounter  Procedures  . EKG 12-Lead  . ECHOCARDIOGRAM COMPLETE   Meds ordered this  encounter  Medications  . DISCONTD: metoprolol tartrate (LOPRESSOR) 25 MG tablet    Sig: Take 1 tablet (25 mg total) by mouth 2 (two) times daily.    Dispense:  180 tablet    Refill:  3    Patient Instructions  Medication Instructions: Dr Sallyanne Kuster has recommended making the following medication changes: 1. STOP Valsartan 2. OKAY TO INCREASE Metoprolol to 25 mg twice daily  Labwork: NONE ORDERED  Testing/Procedures: 1. Echocardiogram - Your physician has requested that you have an echocardiogram. Echocardiography is a painless test that uses sound waves to create images of your heart. It provides your doctor with information about the size and shape of your heart and how well your heart's chambers and valves are working. This procedure takes approximately one hour. There are no restrictions for this procedure. This will be performed at our [facility] location [place], [place] [address] [phone]  Follow-up: Dr Sallyanne Kuster recommends that you follow-up with him as needed.  If you need a refill on your cardiac medications before your next appointment, please call your pharmacy.    Signed, Sanda Klein, MD  05/30/2017 11:33 AM    [facility]

## 2017-05-30 ENCOUNTER — Encounter: Payer: Self-pay | Admitting: Cardiovascular Disease

## 2017-06-04 ENCOUNTER — Ambulatory Visit (HOSPITAL_COMMUNITY): Payer: 59 | Attending: Cardiology

## 2017-06-04 ENCOUNTER — Other Ambulatory Visit: Payer: Self-pay

## 2017-06-04 DIAGNOSIS — E785 Hyperlipidemia, unspecified: Secondary | ICD-10-CM | POA: Insufficient documentation

## 2017-06-04 DIAGNOSIS — I341 Nonrheumatic mitral (valve) prolapse: Secondary | ICD-10-CM | POA: Insufficient documentation

## 2017-06-04 DIAGNOSIS — I10 Essential (primary) hypertension: Secondary | ICD-10-CM | POA: Insufficient documentation

## 2018-05-09 ENCOUNTER — Telehealth: Payer: Self-pay | Admitting: Internal Medicine

## 2018-05-09 NOTE — Telephone Encounter (Signed)
Dr. Hilarie Fredrickson, pt requested you as a doctor.  Pt's previous colonoscopy was on Nov 2013 at [facility].  Pt has family hx of colon cancer.    Records from [facility] will be placed on your desk for review.  Pt is aware we are currently not scheduling procedures due to Covid-19.

## 2018-05-09 NOTE — Telephone Encounter (Signed)
Okay for screening colonoscopy given family history of colon cancer once restrictions from COVID-19 are lifted

## 2018-05-31 NOTE — Telephone Encounter (Signed)
I attempted to reach patient to schedule colonoscopy since COVID restrictions are now being lifted. Unfortunately, both numbers we have listed for patient give "busy" signal. I will attempt to call back tomorrow.

## 2018-06-01 NOTE — Telephone Encounter (Signed)
Attempted to reach patient but no answer and no voice mail.

## 2018-06-02 NOTE — Telephone Encounter (Signed)
Again attempted to reach patient but got no answer and it later gave way to "busy signal."  We will send a letter to his home address.

## 2018-06-15 ENCOUNTER — Encounter: Payer: Self-pay | Admitting: Internal Medicine

## 2018-06-16 ENCOUNTER — Encounter: Payer: Self-pay | Admitting: Internal Medicine

## 2018-06-23 ENCOUNTER — Other Ambulatory Visit: Payer: Self-pay

## 2018-06-23 ENCOUNTER — Encounter: Payer: Self-pay | Admitting: Internal Medicine

## 2018-06-23 ENCOUNTER — Ambulatory Visit: Payer: Self-pay

## 2018-06-23 VITALS — Ht 70.0 in | Wt 190.0 lb

## 2018-06-23 DIAGNOSIS — Z800 Family history of malignant neoplasm of digestive organs: Secondary | ICD-10-CM

## 2018-06-23 MED ORDER — NA SULFATE-K SULFATE-MG SULF 17.5-3.13-1.6 GM/177ML PO SOLN: 1.0000 | Freq: Once | ORAL | 0 refills | Status: AC

## 2018-06-23 NOTE — Progress Notes (Signed)
No egg or soy allergy known to patient  No issues with past sedation with any surgeries  or procedures, no intubation problems  No diet pills per patient No home 02 use per patient  No blood thinners per patient  Pt denies issues with constipation  No A fib or A flutter  EMMI video sent to pt's e mail , pt declined    

## 2018-06-27 ENCOUNTER — Telehealth: Payer: Self-pay

## 2018-06-27 NOTE — Telephone Encounter (Signed)
Covid-19 screening questions  Have you traveled in the last 14 days? No. If yes where?  Do you now or have you had a fever in the last 14 days? No.  Do you have any respiratory symptoms of shortness of breath or cough now or in the last 14 days? No.  Do you have any family members or close contacts with diagnosed or suspected Covid-19 in the past 14 days? No.  Have you been tested for Covid-19 and found to be positive? No.       

## 2018-06-29 ENCOUNTER — Ambulatory Visit (AMBULATORY_SURGERY_CENTER): Payer: BC Managed Care – PPO | Admitting: Internal Medicine

## 2018-06-29 ENCOUNTER — Other Ambulatory Visit: Payer: Self-pay

## 2018-06-29 ENCOUNTER — Encounter: Payer: Self-pay | Admitting: Internal Medicine

## 2018-06-29 VITALS — BP 123/79 | HR 52 | Temp 98.3°F | Resp 9 | Ht 70.0 in | Wt 190.0 lb

## 2018-06-29 DIAGNOSIS — D125 Benign neoplasm of sigmoid colon: Secondary | ICD-10-CM | POA: Diagnosis not present

## 2018-06-29 DIAGNOSIS — Z1211 Encounter for screening for malignant neoplasm of colon: Secondary | ICD-10-CM

## 2018-06-29 DIAGNOSIS — Z8371 Family history of colonic polyps: Secondary | ICD-10-CM | POA: Diagnosis not present

## 2018-06-29 DIAGNOSIS — K635 Polyp of colon: Secondary | ICD-10-CM

## 2018-06-29 DIAGNOSIS — D123 Benign neoplasm of transverse colon: Secondary | ICD-10-CM

## 2018-06-29 DIAGNOSIS — D124 Benign neoplasm of descending colon: Secondary | ICD-10-CM | POA: Diagnosis not present

## 2018-06-29 MED ORDER — SODIUM CHLORIDE 0.9 % IV SOLN: 500.0000 mL | Freq: Once | INTRAVENOUS | Status: DC

## 2018-06-29 NOTE — Progress Notes (Signed)
PT taken to PACU. Monitors in place. VSS. Report given to RN. 

## 2018-06-29 NOTE — Patient Instructions (Signed)
Handouts given for hemorrhoids, diverticulosis and polyps.  YOU HAD AN ENDOSCOPIC PROCEDURE TODAY AT [facility]:   Refer to the procedure report that was given to you for any specific questions about what was found during the examination.  If the procedure report does not answer your questions, please call your gastroenterologist to clarify.  If you requested that your care partner not be given the details of your procedure findings, then the procedure report has been included in a sealed envelope for you to review at your convenience later.  YOU SHOULD EXPECT: Some feelings of bloating in the abdomen. Passage of more gas than usual.  Walking can help get rid of the air that was put into your GI tract during the procedure and reduce the bloating. If you had a lower endoscopy (such as a colonoscopy or flexible sigmoidoscopy) you may notice spotting of blood in your stool or on the toilet paper. If you underwent a bowel prep for your procedure, you may not have a normal bowel movement for a few days.  Please Note:  You might notice some irritation and congestion in your nose or some drainage.  This is from the oxygen used during your procedure.  There is no need for concern and it should clear up in a day or so.  SYMPTOMS TO REPORT IMMEDIATELY:   Following lower endoscopy (colonoscopy or flexible sigmoidoscopy):  Excessive amounts of blood in the stool  Significant tenderness or worsening of abdominal pains  Swelling of the abdomen that is new, acute  Fever of 100F or higher  For urgent or emergent issues, a gastroenterologist can be reached at any hour by calling [phone].   DIET:  We do recommend a small meal at first, but then you may proceed to your regular diet.  Drink plenty of fluids but you should avoid alcoholic beverages for 24 hours.  ACTIVITY:  You should plan to take it easy for the rest of today and you should NOT DRIVE or use heavy machinery until tomorrow  (because of the sedation medicines used during the test).    FOLLOW UP: Our staff will call the number listed on your records 48-72 hours following your procedure to check on you and address any questions or concerns that you may have regarding the information given to you following your procedure. If we do not reach you, we will leave a message.  We will attempt to reach you two times.  During this call, we will ask if you have developed any symptoms of COVID 19. If you develop any symptoms (ie: fever, flu-like symptoms, shortness of breath, cough etc.) before then, please call [phone].  If you test positive for Covid 19 in the 2 weeks post procedure, please call and report this information to [place].    If any biopsies were taken you will be contacted by phone or by letter within the next 1-3 weeks.  Please call us at [phone] if you have not heard about the biopsies in 3 weeks.    SIGNATURES/CONFIDENTIALITY: You and/or your care partner have signed paperwork which will be entered into your electronic medical record.  These signatures attest to the fact that that the information above on your After Visit Summary has been reviewed and is understood.  Full responsibility of the confidentiality of this discharge information lies with you and/or your care-partner.

## 2018-06-29 NOTE — Progress Notes (Signed)
Patient stating his paternal aunt had colon cancer at 26. Patient's sister had adenomatous colon polyps at 54. Patient's last colonoscopy, surgical path record shows hyperplastic polyp finding. Pt's states no medical or surgical changes since previsit or office visit.

## 2018-06-29 NOTE — Progress Notes (Signed)
Called to room to assist during endoscopic procedure.  Patient ID and intended procedure confirmed with present staff. Received instructions for my participation in the procedure from the performing physician.  

## 2018-06-29 NOTE — Op Note (Signed)
[name] Patient Name: Phillip [name] Procedure Date: 06/29/2018 10:22 AM MRN: [id number] Endoscopist: Jerene Bears , MD Age: 68 Referring MD:  Date of Birth: [birth year] Gender: Male Account #: [id number] Procedure:                Colonoscopy Indications:              Colon cancer screening in patient with 1st-degree                            relative having advanced adenoma of the colon (last                            colonoscopy 6 years ago) Medicines:                Monitored Anesthesia Care Procedure:                Pre-Anesthesia Assessment:                           - Prior to the procedure, a History and Physical                            was performed, and patient medications and                            allergies were reviewed. The patient's tolerance of                            previous anesthesia was also reviewed. The risks                            and benefits of the procedure and the sedation                            options and risks were discussed with the patient.                            All questions were answered, and informed consent                            was obtained. Prior Anticoagulants: The patient has                            taken no previous anticoagulant or antiplatelet                            agents. ASA Grade Assessment: II - A patient with                            mild systemic disease. After reviewing the risks                            and benefits, the patient was deemed in  satisfactory condition to undergo the procedure.                           After obtaining informed consent, the colonoscope                            was passed under direct vision. Throughout the                            procedure, the patient's blood pressure, pulse, and                            oxygen saturations were monitored continuously. The                            Colonoscope was introduced  through the anus and                            advanced to the cecum, identified by appendiceal                            orifice and ileocecal valve. The colonoscopy was                            performed without difficulty. The patient tolerated                            the procedure well. The quality of the bowel                            preparation was good. The ileocecal valve,                            appendiceal orifice, and rectum were photographed. Scope In: 10:25:54 AM Scope Out: 10:48:25 AM Scope Withdrawal Time: 0 hours 19 minutes 1 second  Total Procedure Duration: 0 hours 22 minutes 31 seconds  Findings:                 The digital rectal exam was normal.                           Two sessile polyps were found in the transverse                            colon. The polyps were 1 and 5 mm in size. These                            polyps were removed with a cold snare. Resection                            and retrieval were complete.                           A 5 mm polyp was found in the descending  colon. The                            polyp was sessile. The polyp was removed with a                            cold snare. Resection and retrieval were complete.                           Two sessile polyps were found in the sigmoid colon.                            The polyps were 4 to 5 mm in size. These polyps                            were removed with a cold snare. Resection and                            retrieval were complete.                           Multiple medium-mouthed diverticula were found in                            the sigmoid colon.                           Internal hemorrhoids were found during                            retroflexion. The hemorrhoids were small. Complications:            No immediate complications. Impression:               - Two 1 and 5 mm polyps in the transverse colon,                            removed with a cold snare.  Resected and retrieved.                           - One 5 mm polyp in the descending colon, removed                            with a cold snare. Resected and retrieved.                           - Two 4 to 5 mm polyps in the sigmoid colon,                            removed with a cold snare. Resected and retrieved.                           - Diverticulosis in the sigmoid colon.                           -  Small internal hemorrhoids. Recommendation:           - Patient has a contact number available for                            emergencies. The signs and symptoms of potential                            delayed complications were discussed with the                            patient. Return to normal activities tomorrow.                            Written discharge instructions were provided to the                            patient.                           - Resume previous diet.                           - Continue present medications.                           - Await pathology results.                           - Repeat colonoscopy is recommended for                            surveillance. The colonoscopy date will be                            determined after pathology results from today's                            exam become available for review. Jerene Bears, MD 06/29/2018 10:55:19 AM This report has been signed electronically.

## 2018-07-01 ENCOUNTER — Telehealth: Payer: Self-pay

## 2018-07-01 NOTE — Telephone Encounter (Signed)
  Follow up Call-  Call back number 06/29/2018  Post procedure Call Back phone  # [phone]  Permission to leave phone message Yes  Some recent data might be hidden     Patient questions:  Do you have a fever, pain , or abdominal swelling? No. Pain Score  0 *  Have you tolerated food without any problems? Yes.    Have you been able to return to your normal activities? Yes.    Do you have any questions about your discharge instructions: Diet   No. Medications  No. Follow up visit  No.  Do you have questions or concerns about your Care? No.  Actions: * If pain score is 4 or above: No action needed, pain <4.   1. Have you developed a fever since your procedure? no  2.   Have you had an respiratory symptoms (SOB or cough) since your procedure? no  3.   Have you tested positive for COVID 19 since your procedure no  4.   Have you had any family members/close contacts diagnosed with the COVID 19 since your procedure?  no   If yes to any of these questions please route to Joylene John, RN and Alphonsa Gin, Therapist, sports.

## 2018-07-04 ENCOUNTER — Encounter: Payer: Self-pay | Admitting: Internal Medicine

## 2020-01-09 ENCOUNTER — Encounter (HOSPITAL_COMMUNITY): Payer: Self-pay | Admitting: Emergency Medicine

## 2020-01-09 ENCOUNTER — Ambulatory Visit (INDEPENDENT_AMBULATORY_CARE_PROVIDER_SITE_OTHER): Payer: 59

## 2020-01-09 ENCOUNTER — Emergency Department (HOSPITAL_COMMUNITY): Payer: 59

## 2020-01-09 ENCOUNTER — Other Ambulatory Visit: Payer: Self-pay

## 2020-01-09 ENCOUNTER — Emergency Department (HOSPITAL_COMMUNITY)
Admission: EM | Admit: 2020-01-09 | Discharge: 2020-01-09 | Disposition: A | Discharge status: Home / Self Care | Payer: 59 | Attending: Emergency Medicine | Admitting: Emergency Medicine

## 2020-01-09 ENCOUNTER — Other Ambulatory Visit: Payer: Self-pay | Admitting: Medical

## 2020-01-09 DIAGNOSIS — R200 Anesthesia of skin: Secondary | ICD-10-CM | POA: Insufficient documentation

## 2020-01-09 DIAGNOSIS — R002 Palpitations: Secondary | ICD-10-CM

## 2020-01-09 DIAGNOSIS — I10 Essential (primary) hypertension: Secondary | ICD-10-CM | POA: Insufficient documentation

## 2020-01-09 DIAGNOSIS — Z79899 Other long term (current) drug therapy: Secondary | ICD-10-CM | POA: Insufficient documentation

## 2020-01-09 DIAGNOSIS — Z7982 Long term (current) use of aspirin: Secondary | ICD-10-CM | POA: Insufficient documentation

## 2020-01-09 DIAGNOSIS — R0602 Shortness of breath: Secondary | ICD-10-CM | POA: Insufficient documentation

## 2020-01-09 DIAGNOSIS — M79605 Pain in left leg: Secondary | ICD-10-CM | POA: Diagnosis not present

## 2020-01-09 DIAGNOSIS — R0789 Other chest pain: Secondary | ICD-10-CM | POA: Diagnosis not present

## 2020-01-09 DIAGNOSIS — R079 Chest pain, unspecified: Secondary | ICD-10-CM | POA: Diagnosis present

## 2020-01-09 LAB — BASIC METABOLIC PANEL
Anion gap: 8 (ref 5–15)
BUN: 17 mg/dL (ref 8–23)
CO2: 29 mmol/L (ref 22–32)
Calcium: 9.4 mg/dL (ref 8.9–10.3)
Chloride: 105 mmol/L (ref 98–111)
Creatinine, Ser: 0.98 mg/dL (ref 0.61–1.24)
GFR, Estimated: >60 mL/min (ref >60)
Glucose, Bld: 121 mg/dL — ABNORMAL HIGH (ref 70–99)
Potassium: 3.9 mmol/L (ref 3.5–5.1)
Sodium: 142 mmol/L (ref 135–145)

## 2020-01-09 LAB — TROPONIN I (HIGH SENSITIVITY)
Troponin I (High Sensitivity): 7 ng/L (ref <18)
Troponin I (High Sensitivity): 7 ng/L (ref <18)
Troponin I (High Sensitivity): 8 ng/L (ref <18)

## 2020-01-09 LAB — CBC
HCT: 48.6 % (ref 39.0–52.0)
Hemoglobin: 16.3 g/dL (ref 13.0–17.0)
MCH: 30.2 pg (ref 26.0–34.0)
MCHC: 33.5 g/dL (ref 30.0–36.0)
MCV: 90 fL (ref 80.0–100.0)
Platelets: 195 10*3/uL (ref 150–400)
RBC: 5.4 MIL/uL (ref 4.22–5.81)
RDW: 13 % (ref 11.5–15.5)
WBC: 6.1 10*3/uL (ref 4.0–10.5)
nRBC: 0 % (ref 0.0–0.2)

## 2020-01-09 MED ORDER — ASPIRIN 81 MG PO CHEW
324.0000 mg | CHEWABLE_TABLET | Freq: Once | ORAL | Status: AC
  Administered 2020-01-09: 09:00:00 324 mg via ORAL
  Filled 2020-01-09: qty 4

## 2020-01-09 NOTE — Progress Notes (Signed)
Zio patch placed onto patient.  All instructions and information reviewed with patient, they verbalize understanding with no questions. 

## 2020-01-09 NOTE — Discharge Instructions (Addendum)
As we discussed atrial fibrillation is possible but not yet confirmed.  Wear the monitor and follow-up with cardiologist as scheduled.  We discussed possibility of starting anticoagulation today but you agreed to hold off at this time as atrial fibrillation has not yet been confirmed.  Return to the ED if chest pain becomes exertional, associated shortness of breath, nausea, vomiting, sweating, any concerns

## 2020-01-09 NOTE — ED Provider Notes (Signed)
[facility] Provider Note   CSN: [id number] Arrival date & time: 01/09/20  0201     History Chief Complaint  Patient presents with  . Chest Pain    Phillip [name] is a 69 y.o. male.  Patient with history of hypertension, mitral valve prolapse, GERD and anxiety here with episodes of palpitations, tightness in his chest and shortness of breath that onset last evening around midnight when he was try to go to bed.  States he felt anxious and had palpitations and tightness in his jaw and his chest with some shortness of breath.  This resolved after about 2 hours.  He was wearing a cardiac monitor on his iPhone and was noting several episodes of questionable atrial fibrillation which she does not have a history of.  He also states his blood pressure was 149/100.  He feels improved at this time.  He denies any "overt chest pain".  States he had some indigestion after eating some soup last night but this has since resolved.  The palpitations, shortness of breath and jaw tightness of all resolved as well.  He denies any previous cardiac history and saw a cardiologist in 2019.  He had a negative Cardiolite stress test approximately 2015.  He has had palpitations in the past but never been diagnosed with atrial fibrillation. He had echocardiogram in 2015 that showed mitral valve prolapse with an EF of 60 to 65%.  He saw cardiology Dr. Orene Desanctis who advised him to get an apple watch to watch for his palpitations. He states compliance with his beta-blocker and Xanax but admits there may be a component of anxiety Patient states he has been having issues with left-sided sciatica for approximately the past 6 weeks and is due to receive a spinal injection next week.  He was on prednisone up until about 10 days ago.  Takes methocarbamol and Norco as needed for pain.  He describes pain rating down his left leg causing some numbness and tingling.  There is no associated  weakness, bowel or bladder incontinence, fever, vomiting.  No history of IV drug abuse or cancer.  The history is provided by the patient.  Chest Pain Associated symptoms: palpitations   Associated symptoms: no abdominal pain, no dizziness, no fatigue, no fever, no headache, no nausea, no shortness of breath, no vomiting and no weakness        Past Medical History:  Diagnosis Date  . Anxiety   . GERD (gastroesophageal reflux disease)   . Heart murmur   . Hyperlipidemia   . Hypertension   . MVP (mitral valve prolapse)     There are no problems to display for this patient.   Past Surgical History:  Procedure Laterality Date  . CARDIAC CATHETERIZATION    . CHOLECYSTECTOMY, LAPAROSCOPIC  2002  . COLONOSCOPY    . KNEE SURGERY  2007 and 2010       Family History  Problem Relation Age of Onset  . Pulmonary fibrosis Mother   . Heart attack Father   . CAD Father   . Colon polyps Sister   . Colon cancer Paternal Aunt     Social History   Tobacco Use  . Smoking status: Never Smoker  . Smokeless tobacco: Never Used  Vaping Use  . Vaping Use: Never used  Substance Use Topics  . Alcohol use: No  . Drug use: Never    Home Medications Prior to Admission medications   Medication Sig Start Date End  Date Taking? Authorizing Provider  ALPRAZolam Duanne Moron) 0.25 MG tablet Take 0.25 mg by mouth at bedtime as needed for anxiety.    [provider]  aspirin 81 MG tablet Take 81 mg by mouth daily.    [provider]  cholecalciferol (VITAMIN D) 1000 UNITS tablet Take 4,000 Units by mouth daily.    [provider]  fluticasone (FLONASE) 50 MCG/ACT nasal spray Place 2 sprays into both nostrils daily.    [provider]  metoprolol succinate (TOPROL-XL) 25 MG 24 hr tablet Take 25 mg by mouth daily.    [provider]  omega-3 acid ethyl esters (LOVAZA) 1 G capsule Take 2 g by mouth 2 (two) times daily.    [provider]   omeprazole (PRILOSEC) 20 MG capsule Take 20 mg by mouth daily.    [provider]  rosuvastatin (CRESTOR) 5 MG tablet Take 5 mg by mouth daily.    [provider]  valsartan (DIOVAN) 80 MG tablet Take 80 mg by mouth daily. 04/12/18   [provider]    Allergies    Patient has no known allergies.  Review of Systems   Review of Systems  Constitutional: Negative for activity change, fatigue and fever.  HENT: Negative for congestion and rhinorrhea.   Respiratory: Positive for chest tightness. Negative for shortness of breath.   Cardiovascular: Positive for chest pain and palpitations.  Gastrointestinal: Negative for abdominal pain, nausea and vomiting.  Genitourinary: Negative for dysuria, hematuria and urgency.  Musculoskeletal: Negative for arthralgias and myalgias.  Neurological: Negative for dizziness, weakness and headaches.   all other systems are negative except as noted in the HPI and PMH.    Physical Exam Updated Vital Signs BP 131/79 (BP Location: Left Arm)   Pulse 67   Temp 98.5 F (36.9 C) (Oral)   Resp 16   Ht 5\' 9"  (1.753 m)   Wt 92 kg   SpO2 99%   BMI 29.95 kg/m   Physical Exam Vitals and nursing note reviewed.  Constitutional:      General: He is not in acute distress.    Appearance: He is well-developed and well-nourished.     Comments: Anxious appearing  HENT:     Head: Normocephalic and atraumatic.     Mouth/Throat:     Mouth: Oropharynx is clear and moist.     Pharynx: No oropharyngeal exudate.  Eyes:     Extraocular Movements: EOM normal.     Conjunctiva/sclera: Conjunctivae normal.     Pupils: Pupils are equal, round, and reactive to light.  Neck:     Comments: No meningismus. Cardiovascular:     Rate and Rhythm: Normal rate and regular rhythm.     Pulses: Intact distal pulses.     Heart sounds: Normal heart sounds. No murmur heard.   Pulmonary:     Effort: Pulmonary effort is normal. No respiratory distress.      Breath sounds: Normal breath sounds.  Chest:     Chest wall: No tenderness.  Abdominal:     Palpations: Abdomen is soft.     Tenderness: There is no abdominal tenderness. There is no guarding or rebound.  Musculoskeletal:        General: No tenderness or edema.     Cervical back: Normal range of motion and neck supple.     Comments: L paraspinal lumbar tenderness  5/5 strength in bilateral lower extremities. Ankle plantar and dorsiflexion intact. Great toe extension intact bilaterally. +2 DP and  PT pulses. +2 patellar reflexes bilaterally. Normal gait.   Skin:    General: Skin is warm.  Neurological:     Mental Status: He is alert and oriented to person, place, and time.     Cranial Nerves: No cranial nerve deficit.     Motor: No abnormal muscle tone.     Coordination: Coordination normal.     Comments: No ataxia on finger to nose bilaterally. No pronator drift. 5/5 strength throughout. CN 2-12 intact.Equal grip strength. Sensation intact.   Psychiatric:        Mood and Affect: Mood and affect normal.        Behavior: Behavior normal.     ED Results / Procedures / Treatments   Labs (all labs ordered are listed, but only abnormal results are displayed) Labs Reviewed  BASIC METABOLIC PANEL - Abnormal; Notable for the following components:      Result Value   Glucose, Bld 121 (*)    All other components within normal limits  CBC  TROPONIN I (HIGH SENSITIVITY)  TROPONIN I (HIGH SENSITIVITY)  TROPONIN I (HIGH SENSITIVITY)  TROPONIN I (HIGH SENSITIVITY)    EKG EKG Interpretation  Date/Time:  Tuesday January 09 2020 08:09:05 EST Ventricular Rate:  69 PR Interval:    QRS Duration: 89 QT Interval:  393 QTC Calculation: 415 R Axis:   38 Text Interpretation: Sinus rhythm No previous ECGs available Confirmed by Ezequiel Essex [phone]) on 01/09/2020 8:15:57 AM   Radiology DG Chest 2 View  Result Date: 01/09/2020 CLINICAL DATA:  Chest pain EXAM: CHEST - 2 VIEW  COMPARISON:  08/06/2009 FINDINGS: The heart size and mediastinal contours are within normal limits. Both lungs are clear. The visualized skeletal structures are unremarkable. IMPRESSION: No active cardiopulmonary disease. Electronically Signed   By: Ulyses Jarred M.D.   On: 01/09/2020 02:43    Procedures Procedures (including critical care time)  Medications Ordered in ED Medications - No data to display  ED Course  I have reviewed the triage vital signs and the nursing notes.  Pertinent labs & imaging results that were available during my care of the patient were reviewed by me and considered in my medical decision making (see chart for details).          MDM Rules/Calculators/A&P                          Patient with episode of palpitations, shortness of breath and chest tightness last night which is since resolved.  He appears to be in sinus rhythm now.  He does have some questionable episodes of atrial fibrillation noted by his iPhone cardiac monitor.  Patient's iPhone Kardia application does show episodes of questionable atrial fibrillation as depicted above.  He appears to be in sinus rhythm on arrival.  Electrolytes are reassuring and troponins are negative.  Low suspicion for ACS.  Regarding his low back pain this is been an ongoing issue for several weeks.  He has no evidence of cord compression or cauda equina.  Patient did not have any confirmed episodes of atrial fibrillation while in the ED though he was taken off the monitor temporarily due to acutely decompensating patient needing the room.  He remains symptom-free and is sinus rhythm.  Troponins remain negative.  Low suspicion for ACS. He has been seen by Dr. Gardiner Rhyme and the cardiology team.  They discussed risks of benefits of anticoagulation at this time but they will hold off as patient  has not had any episodes of confirmed atrial fibrillation at this point.  He is also going to get epidural injection of his back  in the near future. Patient agrees to hold anticoagulation at this time and understands his risk of stroke as outlined below.  Zio patch to be placed. Patient to continue metoprolol and followup with cardiology clinic.  Return to the ED if chest pain becomes exertional, associated shortness of breath, nausea, vomiting, diaphoresis, any other concerns   This patients CHA2DS2-VASc Score and unadjusted Ischemic Stroke Rate (% per year) is equal to 2.2 % stroke rate/year from a score of 2  Above score calculated as 1 point each if present [CHF, HTN, DM, Vascular=MI/PAD/Aortic Plaque, Age if 65-74, or Male] Above score calculated as 2 points each if present [Age > 75, or Stroke/TIA/TE]     Final Clinical Impression(s) / ED Diagnoses Final diagnoses:  Palpitations  Atypical chest pain    Rx / DC Orders ED Discharge Orders    None       Dena Esperanza, Annie Main, MD 01/09/20 [phone]

## 2020-01-09 NOTE — ED Triage Notes (Signed)
Patient reports central chest tightness / palpitations with SOB this evening , no emesis or diaphoresis , no cough or fever .

## 2020-01-09 NOTE — Consult Note (Addendum)
Cardiology Admission History and Physical:   Patient ID: Phillip [name] MRN: [id number]; DOB: October 29, 1950   Admission date: 01/09/2020  Primary Care Provider: Merryl Hacker No [facility] Cardiologist: Sanda Klein, MD  [place] Electrophysiologist:  None   Chief Complaint: Chest pain  Patient Profile:   Phillip [name] is a 69 y.o. male with past medical history of palpitations, hypertension, dyslipidemia, history of aortic atherosclerosis by CT, GERD, and mitral valve prolapse who is being seen for chest pain.  History of Present Illness:   Phillip [name] is been seen by Dr. Sallyanne Kuster in the past. Per chart review patient reported previous cardiac catheterization over 15 years ago with Dr. Janene Madeira. Records were not found. Since then he had 3 Cardiolite studies which he reported were normal. The most recent study was 2015 and he was on that treadmill for 10 minutes. Images showed normal perfusion and EF of 61%. Echocardiograms in the past have shown mitral valve prolapse but no significant mitral regurgitation. On abdominal CT performed for noncardiac symptoms he had calcium in the aorta. The patient was last seen 05/28/2017 for palpitations suspected PVCs. Echo was repeated and beta-blocker was increased. Echo showed LVEF 60 to 65%, normal wall motion, mild mitral valve prolapse with trivial MR, mildly dilated left atrium.   Patient presented to the ER 01/09/2020 for palpitations and chest tightness. Last night before going to bed the patient started experiencing palpitations and chest tightness. It was across his chest and felt some tightness in his jaw. Not worse with exertion. He felt a little sob. Had some nausea but this possibly was indigestion. When symptoms persisted he checked his Kardia APP and it said he was in possible afib with controlled rates in the 90s. 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 He also took a Xanax. He denies fever, chills, recent  illness. No LLE, orthopnea, dizziness or lightheadedness.   In the ED blood pressure 131/79, pulse 67, afebrile, respiratory rate 16, 99% O2. Noted to be anxious. EKG showed sinus rhythm with a heart rate of 69 bpm, nonspecific T wave changes. Labs showed potassium 5.9, sodium 142, creatinine 0.98, BUN 17, glucose 121, WBC 6.1, hemoglobin 16.3. Chest x-ray was unremarkable. Troponin negative x2. Cardiology was asked to see.    Past Medical History:  Diagnosis Date  . Anxiety   . GERD (gastroesophageal reflux disease)   . Heart murmur   . Hyperlipidemia   . Hypertension   . MVP (mitral valve prolapse)     Past Surgical History:  Procedure Laterality Date  . CARDIAC CATHETERIZATION    . CHOLECYSTECTOMY, LAPAROSCOPIC  2002  . COLONOSCOPY    . KNEE SURGERY  2007 and 2010     Medications Prior to Admission: Prior to Admission medications   Medication Sig Start Date End Date Taking? Authorizing Provider  ALPRAZolam (XANAX) 0.25 MG tablet Take 0.25 mg by mouth at bedtime.   Yes [provider]  aspirin 81 MG tablet Take 81 mg by mouth daily.   Yes [provider]  Brimonidine Tartrate (LUMIFY OP) Apply 1 drop to eye as needed (red eyes).   Yes [provider]  celecoxib (CELEBREX) 200 MG capsule Take 200 mg by mouth daily as needed for pain. 11/03/19  Yes [provider]  cholecalciferol (VITAMIN D) 1000 UNITS tablet Take 2,000 Units by mouth daily.   Yes [provider]  esomeprazole (NEXIUM) 20 MG capsule Take 20 mg by mouth daily as needed (indigestion).  Yes [provider]  HYDROcodone-acetaminophen (NORCO/VICODIN) 5-325 MG tablet Take 0.5 tablets by mouth every 6 (six) hours as needed for pain. 12/12/19  Yes [provider]  metoprolol succinate (TOPROL-XL) 25 MG 24 hr tablet Take 25 mg by mouth daily.   Yes [provider]  omega-3 acid ethyl esters (LOVAZA) 1 G capsule Take 1 g by mouth daily.   Yes [provider]  rosuvastatin (CRESTOR) 5 MG tablet Take 5 mg by mouth daily.   Yes [provider]  valsartan (DIOVAN) 80 MG tablet Take 80 mg by mouth daily. 04/12/18  Yes [provider]     Allergies:   No Known Allergies  Social History:   Social History   Socioeconomic History  . Marital status: Married    Spouse name: Not on file  . Number of children: Not on file  . Years of education: Not on file  . Highest education level: Not on file  Occupational History  . Not on file  Tobacco Use  . Smoking status: Never Smoker  . Smokeless tobacco: Never Used  Vaping Use  . Vaping Use: Never used  Substance and Sexual Activity  . Alcohol use: No  . Drug use: Never  . Sexual activity: Not on file  Other Topics Concern  . Not on file  Social History Narrative  . Not on file   Social Determinants of Health   Financial Resource Strain: Not on file  Food Insecurity: Not on file  Transportation Needs: Not on file  Physical Activity: Not on file  Stress: Not on file  Social Connections: Not on file  Intimate Partner Violence: Not on file    Family History:   The patient's family history includes CAD in his father; Colon cancer in his paternal aunt; Colon polyps in his sister; Heart attack in his father; Pulmonary fibrosis in his mother.    ROS:  Please see the history of present illness.  All other ROS reviewed and negative.     Physical Exam/Data:   Vitals:   01/09/20 0930 01/09/20 0945 01/09/20 1015 01/09/20 1045  BP: 117/81 115/81 126/87 122/86  Pulse: (!) 58 (!) 54 67 (!) 56  Resp: 20 16 (!) 22 15  Temp:      TempSrc:      SpO2: 95% 95% 94% 96%  Weight:      Height:       No intake or output data in the 24 hours ending 01/09/20 1059 Last 3 Weights 01/09/2020 06/29/2018 06/23/2018  Weight (lbs) 202 lb 13.2 oz 190 lb 190 lb  Weight (kg) 92 kg 86.183 kg 86.183 kg     Body mass index is 29.95 kg/m.  General:  Well nourished, well developed, in  no acute distress HEENT: normal Lymph: no adenopathy Neck: no JVD Endocrine:  No thryomegaly Vascular: No carotid bruits; FA pulses 2+ bilaterally without bruits  Cardiac:  normal S1, S2; RRR; no murmur  Lungs:  clear to auscultation bilaterally, no wheezing, rhonchi or rales  Abd: soft, nontender, no hepatomegaly  Ext: no edema Musculoskeletal:  No deformities, BUE and BLE strength normal and equal Skin: warm and dry  Neuro:  CNs 2-12 intact, no focal abnormalities noted Psych:  Normal affect    EKG:  The ECG that was done 01/09/20 was personally reviewed and demonstrates SR 69, TWI aVL  Relevant CV Studies:  Echo 2019 Study Conclusions   - Left ventricle: The cavity size was normal. Wall thickness  was  normal. Systolic function was normal. The estimated ejection  fraction was in the range of 60% to 65%. Wall motion was normal;  there were no regional wall motion abnormalities. Left  ventricular diastolic function parameters were normal.  - Aortic valve: There was no stenosis.  - Mitral valve: Flattening of the mitral valve closure plane. There  was trivial regurgitation.  - Left atrium: The atrium was mildly dilated.  - Right ventricle: The cavity size was normal. Systolic function  was normal.  - Pulmonary arteries: No complete TR doppler jet so unable to  estimate PA systolic pressure.  - Inferior vena cava: The vessel was normal in size. The  respirophasic diameter changes were in the normal range (>= 50%),  consistent with normal central venous pressure.   Myoview Lexiscan 2015 Overall Impression:  Normal stress nuclear study.  there is no scar or ischemia. The patient had good exercise tolerance. This is a low risk scan.  LV Ejection Fraction: 61%.  LV Wall Motion:  Normal Wall Motion.  Carlena Bjornstad, MD  Laboratory Data:  High Sensitivity Troponin:   Recent Labs  Lab 01/09/20 0222 01/09/20 0424  TROPONINIHS 7 7       Chemistry Recent Labs  Lab 01/09/20 0222  NA 142  K 3.9  CL 105  CO2 29  GLUCOSE 121*  BUN 17  CREATININE 0.98  CALCIUM 9.4  GFRNONAA >60  ANIONGAP 8    No results for input(s): PROT, ALBUMIN, AST, ALT, ALKPHOS, BILITOT in the last 168 hours. Hematology Recent Labs  Lab 01/09/20 0222  WBC 6.1  RBC 5.40  HGB 16.3  HCT 48.6  MCV 90.0  MCH 30.2  MCHC 33.5  RDW 13.0  PLT 195   BNPNo results for input(s): BNP, PROBNP in the last 168 hours.  DDimer No results for input(s): DDIMER in the last 168 hours.   Radiology/Studies:  DG Chest 2 View  Result Date: 01/09/2020 CLINICAL DATA:  Chest pain EXAM: CHEST - 2 VIEW COMPARISON:  08/06/2009 FINDINGS: The heart size and mediastinal contours are within normal limits. Both lungs are clear. The visualized skeletal structures are unremarkable. IMPRESSION: No active cardiopulmonary disease. Electronically Signed   By: Ulyses Jarred M.D.   On: 01/09/2020 02:43     Assessment and Plan:   Palpitations and chest tightness - Presents with palpitations and chest tightness. HS trop negativex3. Labs unremarkable. Kardia APP with possible afib. He takes toprol XL daily.  - EKG shows SR  - Continue home Toprol - Needs outpatient echo and zio monitor. - Will also check TSH and Mag - Will set up ER follow-up   Hypertension - PTA valsartan 80 mg daily, Toprol-XL 25 mg daily - Bps reasonable  Dyslipidemia -continue rosuvastatin 5 mg daily  Mild mitral valve prolapse -Seen on echo 2019 -repeat echo as above   For questions or updates, please contact [place] Please consult [email or web address] for contact info under     Signed, Cadence Ninfa Meeker, PA-C  01/09/2020 10:59 AM   Patient seen and examined.  Agree with above documentation.  Mr Badley is a 69 y.o. male with past medical history of palpitations, hypertension, dyslipidemia, GERD, and mitral valve prolapse who we are consulted to see for evaluation of palpitations.  He  reported palpitations starting last night.  Checked his Jodelle Red App and showed possible Afib.  Rates were in 90s.  Reported some tightness in chest during episode.  On presentation to ED, EKG showed sinus  rhythm with rate 69.  He was initially on telemetry and there were reports of possible Afib but unfortunately he was moved to a hallway bed and his telemetry recordings were lost.  Troponin 7>8.  No evidence of ACS.  He is asymptomatic.  Recommend outpatient Zio x 2 weeks to evaluate for Afib.  Will schedule follow-up.  Donato Heinz, MD

## 2020-01-11 ENCOUNTER — Telehealth: Payer: Self-pay | Admitting: Cardiovascular Disease

## 2020-01-11 NOTE — Telephone Encounter (Signed)
[employer] wanted to let Dr. Sallyanne Kuster know that the company attempted to contact the patient to discuss costs of the monitor but the company was not able to reach the patient.

## 2020-01-12 NOTE — Telephone Encounter (Signed)
Acknowledged.

## 2020-02-02 ENCOUNTER — Other Ambulatory Visit: Payer: 59 | Admitting: *Deleted

## 2020-02-02 ENCOUNTER — Other Ambulatory Visit: Payer: Self-pay

## 2020-02-02 ENCOUNTER — Ambulatory Visit (HOSPITAL_COMMUNITY): Payer: 59 | Attending: Medical

## 2020-02-02 DIAGNOSIS — R002 Palpitations: Secondary | ICD-10-CM

## 2020-02-02 DIAGNOSIS — R079 Chest pain, unspecified: Secondary | ICD-10-CM | POA: Diagnosis not present

## 2020-02-02 LAB — ECHOCARDIOGRAM COMPLETE
Area-P 1/2: 2.48 cm2
S' Lateral: 3.2 cm

## 2020-02-03 LAB — TSH: TSH: 1.96 u[IU]/mL (ref 0.450–4.500)

## 2020-02-03 LAB — MAGNESIUM: Magnesium: 2 mg/dL (ref 1.6–2.3)

## 2020-02-13 ENCOUNTER — Other Ambulatory Visit: Payer: Self-pay

## 2020-02-13 ENCOUNTER — Emergency Department (HOSPITAL_BASED_OUTPATIENT_CLINIC_OR_DEPARTMENT_OTHER)
Admission: EM | Admit: 2020-02-13 | Discharge: 2020-02-13 | Disposition: A | Discharge status: Home / Self Care | Payer: 59 | Attending: Emergency Medicine | Admitting: Emergency Medicine

## 2020-02-13 ENCOUNTER — Emergency Department (HOSPITAL_BASED_OUTPATIENT_CLINIC_OR_DEPARTMENT_OTHER): Payer: 59

## 2020-02-13 ENCOUNTER — Encounter (HOSPITAL_BASED_OUTPATIENT_CLINIC_OR_DEPARTMENT_OTHER): Payer: Self-pay

## 2020-02-13 DIAGNOSIS — Z20822 Contact with and (suspected) exposure to covid-19: Secondary | ICD-10-CM | POA: Insufficient documentation

## 2020-02-13 DIAGNOSIS — K219 Gastro-esophageal reflux disease without esophagitis: Secondary | ICD-10-CM | POA: Insufficient documentation

## 2020-02-13 DIAGNOSIS — Z7982 Long term (current) use of aspirin: Secondary | ICD-10-CM | POA: Insufficient documentation

## 2020-02-13 DIAGNOSIS — Z79899 Other long term (current) drug therapy: Secondary | ICD-10-CM | POA: Insufficient documentation

## 2020-02-13 DIAGNOSIS — R110 Nausea: Secondary | ICD-10-CM | POA: Diagnosis not present

## 2020-02-13 DIAGNOSIS — I10 Essential (primary) hypertension: Secondary | ICD-10-CM | POA: Diagnosis not present

## 2020-02-13 DIAGNOSIS — R1030 Lower abdominal pain, unspecified: Secondary | ICD-10-CM | POA: Diagnosis not present

## 2020-02-13 HISTORY — DX: Other intervertebral disc displacement, lumbar region: M51.26

## 2020-02-13 HISTORY — DX: Unspecified atrial fibrillation: I48.91

## 2020-02-13 LAB — COMPREHENSIVE METABOLIC PANEL
ALT: 22 U/L (ref 0–44)
AST: 18 U/L (ref 15–41)
Albumin: 4.1 g/dL (ref 3.5–5.0)
Alkaline Phosphatase: 94 U/L (ref 38–126)
Anion gap: 10 (ref 5–15)
BUN: 13 mg/dL (ref 8–23)
CO2: 26 mmol/L (ref 22–32)
Calcium: 9.1 mg/dL (ref 8.9–10.3)
Chloride: 100 mmol/L (ref 98–111)
Creatinine, Ser: 0.99 mg/dL (ref 0.61–1.24)
GFR, Estimated: >60 mL/min (ref >60)
Glucose, Bld: 115 mg/dL — ABNORMAL HIGH (ref 70–99)
Potassium: 4.1 mmol/L (ref 3.5–5.1)
Sodium: 136 mmol/L (ref 135–145)
Total Bilirubin: 0.8 mg/dL (ref 0.3–1.2)
Total Protein: 7.4 g/dL (ref 6.5–8.1)

## 2020-02-13 LAB — CBC
HCT: 49.6 % (ref 39.0–52.0)
Hemoglobin: 17.3 g/dL — ABNORMAL HIGH (ref 13.0–17.0)
MCH: 30.4 pg (ref 26.0–34.0)
MCHC: 34.9 g/dL (ref 30.0–36.0)
MCV: 87.2 fL (ref 80.0–100.0)
Platelets: 218 10*3/uL (ref 150–400)
RBC: 5.69 MIL/uL (ref 4.22–5.81)
RDW: 12.9 % (ref 11.5–15.5)
WBC: 9.8 10*3/uL (ref 4.0–10.5)
nRBC: 0 % (ref 0.0–0.2)

## 2020-02-13 LAB — URINALYSIS, ROUTINE W REFLEX MICROSCOPIC
Bilirubin Urine: NEGATIVE
Glucose, UA: NEGATIVE mg/dL
Hgb urine dipstick: NEGATIVE
Ketones, ur: NEGATIVE mg/dL
Leukocytes,Ua: NEGATIVE
Nitrite: NEGATIVE
Protein, ur: NEGATIVE mg/dL
Specific Gravity, Urine: 1.025 (ref 1.005–1.030)
pH: 6 (ref 5.0–8.0)

## 2020-02-13 LAB — LIPASE, BLOOD: Lipase: 25 U/L (ref 11–51)

## 2020-02-13 MED ORDER — ONDANSETRON 4 MG PO TBDP: 4.0000 mg | ORAL_TABLET | Freq: Three times a day (TID) | ORAL | 1 refills | Status: DC | PRN

## 2020-02-13 MED ORDER — IOHEXOL 300 MG/ML  SOLN
100.0000 mL | Freq: Once | INTRAMUSCULAR | Status: AC | PRN
  Administered 2020-02-13: 100 mL via INTRAVENOUS

## 2020-02-13 MED ORDER — ONDANSETRON HCL 4 MG/2ML IJ SOLN
4.0000 mg | Freq: Once | INTRAMUSCULAR | Status: AC
  Administered 2020-02-13: 4 mg via INTRAVENOUS
  Filled 2020-02-13: qty 2

## 2020-02-13 NOTE — ED Notes (Signed)
Verbal order Dr Rogene Houston CT abd

## 2020-02-13 NOTE — ED Notes (Signed)
ED Provider at bedside. 

## 2020-02-13 NOTE — Discharge Instructions (Signed)
CT scan without evidence of any acute process.  Did show some fluid in the small intestines.  But no evidence of bowel obstruction.  Labs are normal.  I will go ahead and do formal COVID testing you can follow-up your results on MyChart.  I have printed your lab results so that you have them.  To include your CT scan results.  Take the Zofran dissolvable tablet as needed for nausea.  Would recommend liquid diet then advance to a bland diet.  Would also consider following up with your primary care doctor regarding the elevated blood pressure and also may be follow-up with your gastroenterologist for the abdominal pain.  Return for any new or worse symptoms prickly abdominal cramping or any persistent vomiting

## 2020-02-13 NOTE — ED Triage Notes (Addendum)
Pt c/o lower abd pain, nausea x 3 days-states he was sent by PCP for "acute abd"-NAD-steady gait

## 2020-02-13 NOTE — ED Provider Notes (Signed)
[place] EMERGENCY DEPARTMENT Provider Note   CSN: [id number] Arrival date & time: 02/13/20  1634     History Chief Complaint  Patient presents with  . Abdominal Pain    Phillip [name] is a 70 y.o. male.  Patient sent in by his primary care doctor for CT scan of abdomen.  Patient has had bilateral lower abdominal pain now for 3 days associated with nausea no vomiting no diarrhea.  Having normal bowel movements.  Patient did do a home COVID test which was negative.  Was a rapid test.  Patient without fever without cough without congestion.  However the illness did start off with pretty significant body aches.  But those have resolved.  Patient does state that eating seems to make the pain worse.        Past Medical History:  Diagnosis Date  . A-fib ([name])   . Anxiety   . GERD (gastroesophageal reflux disease)   . Heart murmur   . Hyperlipidemia   . Hypertension   . Lumbar herniated disc   . MVP (mitral valve prolapse)     There are no problems to display for this patient.   Past Surgical History:  Procedure Laterality Date  . CARDIAC CATHETERIZATION    . CHOLECYSTECTOMY, LAPAROSCOPIC  2002  . COLONOSCOPY    . KNEE SURGERY  2007 and 2010       Family History  Problem Relation Age of Onset  . Pulmonary fibrosis Mother   . Heart attack Father   . CAD Father   . Colon polyps Sister   . Colon cancer Paternal Aunt     Social History   Tobacco Use  . Smoking status: Never Smoker  . Smokeless tobacco: Never Used  Vaping Use  . Vaping Use: Never used  Substance Use Topics  . Alcohol use: No  . Drug use: Never    Home Medications Prior to Admission medications   Medication Sig Start Date End Date Taking? Authorizing Provider  ondansetron (ZOFRAN ODT) 4 MG disintegrating tablet Take 1 tablet (4 mg total) by mouth every 8 (eight) hours as needed for nausea or vomiting. 02/13/20  Yes Fredia Sorrow, MD  ALPRAZolam Duanne Moron) 0.25 MG tablet  Take 0.25 mg by mouth at bedtime.    [provider]  aspirin 81 MG tablet Take 81 mg by mouth daily.    [provider]  Brimonidine Tartrate (LUMIFY OP) Apply 1 drop to eye as needed (red eyes).    [provider]  celecoxib (CELEBREX) 200 MG capsule Take 200 mg by mouth daily as needed for pain. 11/03/19   [provider]  cholecalciferol (VITAMIN D) 1000 UNITS tablet Take 2,000 Units by mouth daily.    [provider]  esomeprazole (NEXIUM) 20 MG capsule Take 20 mg by mouth daily as needed (indigestion).    [provider]  HYDROcodone-acetaminophen (NORCO/VICODIN) 5-325 MG tablet Take 0.5 tablets by mouth every 6 (six) hours as needed for pain. 12/12/19   [provider]  metoprolol succinate (TOPROL-XL) 25 MG 24 hr tablet Take 25 mg by mouth daily.    [provider]  omega-3 acid ethyl esters (LOVAZA) 1 G capsule Take 1 g by mouth daily.    [provider]  rosuvastatin (CRESTOR) 5 MG tablet Take 5 mg by mouth daily.    [provider]  valsartan (DIOVAN) 80 MG tablet Take 80 mg by mouth daily. 04/12/18   [provider]  Allergies    Patient has no known allergies.  Review of Systems   Review of Systems  Constitutional: Positive for appetite change. Negative for chills and fever.  HENT: Negative for congestion, rhinorrhea and sore throat.   Eyes: Negative for visual disturbance.  Respiratory: Negative for cough and shortness of breath.   Cardiovascular: Negative for chest pain and leg swelling.  Gastrointestinal: Positive for abdominal pain and nausea. Negative for diarrhea and vomiting.  Genitourinary: Negative for dysuria.  Musculoskeletal: Positive for myalgias. Negative for back pain and neck pain.  Skin: Negative for rash.  Neurological: Negative for dizziness, light-headedness and headaches.  Hematological: Does not bruise/bleed easily.  Psychiatric/Behavioral: Negative for  confusion.    Physical Exam Updated Vital Signs BP (!) 146/92   Pulse 71   Temp 98.5 F (36.9 C) (Oral)   Resp 19   Ht 1.753 m (5\' 9" )   Wt 89.4 kg   SpO2 98%   BMI 29.09 kg/m   Physical Exam Vitals and nursing note reviewed.  Constitutional:      General: He is not in acute distress.    Appearance: Normal appearance. He is well-developed and well-nourished.  HENT:     Head: Normocephalic and atraumatic.  Eyes:     Conjunctiva/sclera: Conjunctivae normal.     Pupils: Pupils are equal, round, and reactive to light.  Cardiovascular:     Rate and Rhythm: Normal rate and regular rhythm.     Heart sounds: No murmur heard.   Pulmonary:     Effort: Pulmonary effort is normal. No respiratory distress.     Breath sounds: Normal breath sounds.  Abdominal:     Palpations: Abdomen is soft.     Tenderness: There is abdominal tenderness. There is no guarding.     Comments: Mild discomfort to palpation to the lower quadrants.  No guarding.  Musculoskeletal:        General: No edema. Normal range of motion.     Cervical back: Normal range of motion and neck supple.  Skin:    General: Skin is warm and dry.     Capillary Refill: Capillary refill takes less than 2 seconds.  Neurological:     General: No focal deficit present.     Mental Status: He is alert and oriented to person, place, and time.     Cranial Nerves: No cranial nerve deficit.     Sensory: No sensory deficit.     Motor: No weakness.  Psychiatric:        Mood and Affect: Mood and affect normal.     ED Results / Procedures / Treatments   Labs (all labs ordered are listed, but only abnormal results are displayed) Labs Reviewed  COMPREHENSIVE METABOLIC PANEL - Abnormal; Notable for the following components:      Result Value   Glucose, Bld 115 (*)    All other components within normal limits  CBC - Abnormal; Notable for the following components:   Hemoglobin 17.3 (*)    All other components within normal  limits  SARS CORONAVIRUS 2 (TAT 6-24 HRS)  LIPASE, BLOOD  URINALYSIS, ROUTINE W REFLEX MICROSCOPIC    EKG None  Radiology CT ABDOMEN PELVIS W CONTRAST  Result Date: 02/13/2020 CLINICAL DATA:  Abdomen distension EXAM: CT ABDOMEN AND PELVIS WITH CONTRAST TECHNIQUE: Multidetector CT imaging of the abdomen and pelvis was performed using the standard protocol following bolus administration of intravenous contrast. CONTRAST:  129mL OMNIPAQUE IOHEXOL 300 MG/ML  SOLN COMPARISON:  CT  09/26/2015 FINDINGS: Lower chest: Lung bases demonstrate no acute consolidation or effusion. Hepatobiliary: No focal liver abnormality is seen. Status post cholecystectomy. No biliary dilatation. Pancreas: Unremarkable. No pancreatic ductal dilatation or surrounding inflammatory changes. Spleen: Normal in size without focal abnormality. Adrenals/Urinary Tract: Adrenal glands are normal. Kidneys show no hydronephrosis. 7 mm stone in the upper pole left kidney. Bladder is unremarkable Stomach/Bowel: Stomach nonenlarged. Mildly dilated fluid-filled loops of mid small bowel measuring up to 3 cm with gradual transition to fluid-filled nondistended small bowel. Negative appendix. No acute bowel wall thickening. Vascular/Lymphatic: Moderate aortic atherosclerosis without aneurysm. Retroaortic left renal vein. No suspicious nodes Reproductive: Prostate is unremarkable. Other: No free air.  Trace free fluid in the pelvis Musculoskeletal: No acute or significant osseous findings. IMPRESSION: 1. Mildly dilated fluid-filled loops of mid small bowel with gradual transition to fluid-filled nondistended small bowel. Ileus or enteritis is favored over low-grade/partial bowel obstruction. Negative for bowel wall thickening. 2. Trace free fluid in the pelvis. 3. 7 mm nonobstructing stone in the upper pole of the left kidney. Aortic Atherosclerosis (ICD10-I70.0). Electronically Signed   By: Donavan Foil M.D.   On: 02/13/2020 19:34     Procedures Procedures (including critical care time)  Medications Ordered in ED Medications  iohexol (OMNIPAQUE) 300 MG/ML solution 100 mL (100 mLs Intravenous Contrast Given 02/13/20 1908)  ondansetron (ZOFRAN) injection 4 mg (4 mg Intravenous Given 02/13/20 2235)    ED Course  I have reviewed the triage vital signs and the nursing notes.  Pertinent labs & imaging results that were available during my care of the patient were reviewed by me and considered in my medical decision making (see chart for details).    MDM Rules/Calculators/A&P                          Labs without any significant abnormalities.  Blood sugar at 115.  No leukocytosis.  CT scan of the abdomen shows some increased fluid in the small intestines but is not consistent with small bowel obstruction.  Patient also not having any vomiting or any crampy type abdominal pain.  Just has sort of gnawing kind of pain in the lower quadrants.  No evidence of any other acute process.  Since patient started off with generalized body aches we will go ahead and retest for COVID.  Patient can follow-up the results on MyChart.  We will also start him on Zofran ODT.  Him follow back up with his primary care doctor for the hypertension and the persistent abdominal pain.  And also make an appointment to follow back up with his gastroenterologist.  Patient given warning about developing small bowel obstruction.  But no evidence of it here today he will return if he starts to have persistent vomiting or severe crampy abdominal pain.  Patient nontoxic no acute distress here.   Final Clinical Impression(s) / ED Diagnoses Final diagnoses:  Lower abdominal pain  Primary hypertension    Rx / DC Orders ED Discharge Orders         Ordered    ondansetron (ZOFRAN ODT) 4 MG disintegrating tablet  Every 8 hours PRN        02/13/20 2251           Fredia Sorrow, MD 02/13/20 2256

## 2020-02-14 LAB — SARS CORONAVIRUS 2 (TAT 6-24 HRS): SARS Coronavirus 2: NEGATIVE

## 2020-02-15 ENCOUNTER — Ambulatory Visit: Payer: 59 | Admitting: Cardiovascular Disease

## 2020-04-08 DIAGNOSIS — I341 Nonrheumatic mitral (valve) prolapse: Secondary | ICD-10-CM | POA: Insufficient documentation

## 2020-04-08 DIAGNOSIS — I700 Atherosclerosis of aorta: Secondary | ICD-10-CM | POA: Insufficient documentation

## 2020-04-08 DIAGNOSIS — R002 Palpitations: Secondary | ICD-10-CM | POA: Insufficient documentation

## 2020-04-08 DIAGNOSIS — I480 Paroxysmal atrial fibrillation: Secondary | ICD-10-CM | POA: Insufficient documentation

## 2020-04-08 DIAGNOSIS — K219 Gastro-esophageal reflux disease without esophagitis: Secondary | ICD-10-CM | POA: Insufficient documentation

## 2021-01-10 ENCOUNTER — Ambulatory Visit: Payer: 59 | Admitting: Gastroenterology

## 2021-01-31 ENCOUNTER — Ambulatory Visit: Payer: 59 | Admitting: Gastroenterology

## 2021-01-31 ENCOUNTER — Encounter: Payer: Self-pay | Admitting: Gastroenterology

## 2021-01-31 VITALS — BP 130/80 | HR 71 | Ht 69.0 in | Wt 208.0 lb

## 2021-01-31 DIAGNOSIS — K580 Irritable bowel syndrome with diarrhea: Secondary | ICD-10-CM

## 2021-01-31 DIAGNOSIS — M5126 Other intervertebral disc displacement, lumbar region: Secondary | ICD-10-CM | POA: Insufficient documentation

## 2021-01-31 MED ORDER — RIFAXIMIN 550 MG PO TABS: 550.0000 mg | ORAL_TABLET | Freq: Three times a day (TID) | ORAL | 0 refills | Status: DC

## 2021-01-31 NOTE — Patient Instructions (Signed)
We have sent the following medications to your pharmacy for you to pick up at your convenience: Xifaxan 550 mg three times daily for 14 days.  If you are age 70 or older, your body mass index should be between 23-30. Your Body mass index is 30.72 kg/m. If this is out of the aforementioned range listed, please consider follow up with your Primary Care Provider.  If you are age 70 or younger, your body mass index should be between 19-25. Your Body mass index is 30.72 kg/m. If this is out of the aformentioned range listed, please consider follow up with your Primary Care Provider.   ________________________________________________________  The [facility] providers would like to encourage you to use [facility] to communicate with providers for non-urgent requests or questions.  Due to long hold times on the telephone, sending your provider a message by [facility] may be a faster and more efficient way to get a response.  Please allow 48 business hours for a response.  Please remember that this is for non-urgent requests.  _______________________________________________________

## 2021-01-31 NOTE — Progress Notes (Signed)
01/31/2021 Phillip [name] [id number] [birth year]   HISTORY OF PRESENT ILLNESS: This is a 71 year old male who is a patient of Dr. Vena Rua.  He is really only been seen here for colonoscopy in June 2020 as follows:  - Two 1 and 5 mm polyps in the transverse colon, removed with a cold snare. Resected and retrieved. - One 5 mm polyp in the descending colon, removed with a cold snare. Resected and retrieved. - Two 4 to 5 mm polyps in the sigmoid colon, removed with a cold snare. Resected and retrieved. - Diverticulosis in the sigmoid colon. - Small internal hemorrhoids.  1. Surgical [P], colon, transverse, polyp (3) - TUBULAR ADENOMA(S) WITHOUT HIGH-GRADE DYSPLASIA OR MALIGNANCY 2. Surgical [P], colon, sigmoid and descending, polyp (3) - TUBULAR ADENOMA WITHOUT HIGH-GRADE DYSPLASIA OR MALIGNANCY - HYPERPLASTIC POLYP - INFLAMMATORY POLYP  Anyway, he presents here today with complaints of diarrhea.  He says that this has been [name] on for several years.  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  He said that he never really had any improvement in his symptoms with colestipol or cholestyramine.  He says that he was checked for celiac disease in the past that was negative.  He tells me that he was treated with cefdinir years ago for some other type of infection and had significant improvement in his diarrhea that lasted several months.  He says that later down the road it was used to treat something else and he once again had significant improvement with that.  He says his PCP gave him a course of metronidazole to see if that would help with the diarrhea, but he did not have nearly as much improvement with that.  Past Medical History:  Diagnosis Date   A-fib ([place])    Anxiety    GERD (gastroesophageal reflux disease)    Heart murmur    Hyperlipidemia    Hypertension    Lumbar herniated disc    MVP (mitral valve  prolapse)    Past Surgical History:  Procedure Laterality Date   CARDIAC CATHETERIZATION     CHOLECYSTECTOMY, LAPAROSCOPIC  2002   COLONOSCOPY     KNEE SURGERY  2007 and 2010    reports that he has never smoked. He has never used smokeless tobacco. He reports that he does not drink alcohol and does not use drugs. family history includes CAD in his father; Colon cancer in his paternal aunt; Colon polyps in his sister; Heart attack in his father; Pulmonary fibrosis in his mother. No Known Allergies    Outpatient Encounter Medications as of 01/31/2021  Medication Sig   ALPRAZolam (XANAX) 0.25 MG tablet Take 0.25 mg by mouth at bedtime.   aspirin 325 MG tablet Take 325 mg by mouth daily.   Brimonidine Tartrate (LUMIFY OP) Apply 1 drop to eye as needed (red eyes).   celecoxib (CELEBREX) 200 MG capsule Take 200 mg by mouth daily as needed for pain.   cholecalciferol (VITAMIN D) 1000 UNITS tablet Take 2,000 Units by mouth daily.   esomeprazole (NEXIUM) 20 MG capsule Take 20 mg by mouth daily as needed (indigestion).   metoprolol succinate (TOPROL-XL) 25 MG 24 hr tablet Take 25 mg by mouth daily.   omega-3 acid ethyl esters (LOVAZA) 1 G capsule Take 1 g by mouth daily.   rosuvastatin (CRESTOR) 5 MG tablet Take 5 mg by mouth daily.   valsartan (DIOVAN) 80  MG tablet Take 80 mg by mouth daily.   HYDROcodone-acetaminophen (NORCO/VICODIN) 5-325 MG tablet Take 0.5 tablets by mouth every 6 (six) hours as needed for pain. (Patient not taking: Reported on 01/31/2021)   [DISCONTINUED] aspirin 81 MG tablet Take 81 mg by mouth daily. (Patient not taking: Reported on 01/31/2021)   [DISCONTINUED] ondansetron (ZOFRAN ODT) 4 MG disintegrating tablet Take 1 tablet (4 mg total) by mouth every 8 (eight) hours as needed for nausea or vomiting.   No facility-administered encounter medications on file as of 01/31/2021.    REVIEW OF SYSTEMS  : All other systems reviewed and negative except where noted in the History of  Present Illness.   PHYSICAL EXAM: BP 130/80    Pulse 71    Ht 5\' 9"  (1.753 m)    Wt 208 lb (94.3 kg)    SpO2 97%    BMI 30.72 kg/m  General: Well developed white male in no acute distress Head: Normocephalic and atraumatic Eyes:  Sclerae anicteric, conjunctiva pink. Ears: Normal auditory acuity Lungs: Clear throughout to auscultation; no W/R/R. Heart: Regular rate and rhythm; no M/R/G. Abdomen: Soft, non-distended.  BS present.  Non-tender. Musculoskeletal: Symmetrical with no gross deformities  Skin: No lesions on visible extremities Extremities: No edema  Neurological: Alert oriented x 4, grossly non-focal Psychological:  Alert and cooperative. 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  Says that he was tested for celiac disease and that was negative.  No improvement with colestipol or cholestyramine in the past.  Reports that he had significant improvement with cefdinir in the past when it was used to treat something different.  He says that the effects lasted for several months.  This occurred on 2 separate occasions.  Was treated with metronidazole with some improvement, but not nearly as good.  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  We will treat with Xifaxan 550 mg 3 times daily for 14 days.  He will call or message [place] back in a few weeks with an update on his symptoms.  The only other consideration would be if he has microscopic colitis so may need repeat colonoscopy with random biopsies if this does not help, however, would not expect that he would have that much improvement with antibiotics.  CC:  No ref. provider found

## 2021-02-05 ENCOUNTER — Telehealth: Payer: Self-pay | Admitting: *Deleted

## 2021-02-05 NOTE — Telephone Encounter (Signed)
PA submitted and approved via Covermymeds for Xifaxan.

## 2021-02-06 ENCOUNTER — Encounter: Payer: Self-pay | Admitting: Gastroenterology

## 2021-02-06 DIAGNOSIS — K580 Irritable bowel syndrome with diarrhea: Secondary | ICD-10-CM | POA: Insufficient documentation

## 2021-02-17 NOTE — Progress Notes (Signed)
Addendum: Reviewed and agree with assessment and management plan. Casara Perrier M, MD  

## 2021-04-04 ENCOUNTER — Encounter: Payer: Self-pay | Admitting: *Deleted

## 2021-04-10 ENCOUNTER — Ambulatory Visit (INDEPENDENT_AMBULATORY_CARE_PROVIDER_SITE_OTHER): Payer: 59 | Admitting: Internal Medicine

## 2021-04-10 ENCOUNTER — Encounter: Payer: Self-pay | Admitting: Internal Medicine

## 2021-04-10 VITALS — BP 144/92 | HR 62 | Ht 69.0 in | Wt 208.1 lb

## 2021-04-10 DIAGNOSIS — R152 Fecal urgency: Secondary | ICD-10-CM | POA: Diagnosis not present

## 2021-04-10 DIAGNOSIS — R197 Diarrhea, unspecified: Secondary | ICD-10-CM

## 2021-04-10 DIAGNOSIS — R195 Other fecal abnormalities: Secondary | ICD-10-CM

## 2021-04-10 DIAGNOSIS — Z86010 Personal history of colonic polyps: Secondary | ICD-10-CM

## 2021-04-10 MED ORDER — NA SULFATE-K SULFATE-MG SULF 17.5-3.13-1.6 GM/177ML PO SOLN: 1.0000 | ORAL | 0 refills | Status: DC

## 2021-04-10 NOTE — Patient Instructions (Signed)
A high fiber diet with plenty of fluids (up to 8 glasses of water daily) is suggested to relieve these symptoms.  Metamucil, 2 tablespoon once daily can be used to keep bowels regular if needed. ? ?You have been given samples of Zenpep - Take 2 capsules with each meal daily.  ? ?You have been scheduled for a colonoscopy. Please follow written instructions given to you at your visit today.  ?Please pick up your prep supplies at the pharmacy within the next 1-3 days. ?If you use inhalers (even only as needed), please bring them with you on the day of your procedure. ? ?We have sent the following medications to your pharmacy for you to pick up at your convenience: ?[place]  ? ?If you are age 70 or older, your body mass index should be between 23-30. Your Body mass index is 30.73 kg/m?Marland Kitchen If this is out of the aforementioned range listed, please consider follow up with your Primary Care Provider. ? ? ?The [facility] providers would like to encourage you to use [facility] to communicate with providers for non-urgent requests or questions.  Due to long hold times on the telephone, sending your provider a message by [facility] may be a faster and more efficient way to get a response.  Please allow 48 business hours for a response.  Please remember that this is for non-urgent requests.  ? ?Thank you for choosing me and [facility]. ? ? ? ? ?

## 2021-04-10 NOTE — Progress Notes (Signed)
? ?Subjective:  ? ? Patient ID: Phillip [name], male    DOB: [birth year], 71 y.o.   MRN: [id number] ? ?HPI ?[name] is a 71 year old male with a history of adenomatous colon polyps, sigmoid diverticulosis, small hemorrhoids, chronic diarrhea, hypertension, hyperlipidemia, history of A-fib who is seen in follow-up to evaluate chronic diarrhea.  He was last here in January to see Alonza Bogus, PA-C. ? ?At the time of his last visit he discussed longstanding diarrhea which had previously responded to cefdinir.  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  They also discussed the possibility of bile salt diarrhea. ? ?He reports that he took rifaximin and it may have helped but certainly less than 37%.  In the past he has had 2 rounds of cefdinir for sinusitis in both caused "miraculous" resolution of his loose and urgent stools which lasted about 6 months each.  In the past he has tried cholestyramine and colestipol without benefit. ? ?On further questioning his bowel pattern is really not that of daily diarrhea.  He will have 2 to 3 days without bowel movement followed by 1 to 2 days of "complete emptying".  Also during this time he will have the feeling of incomplete evacuation.  His abdomen can become distended before this process of bowel movements start.  On the days that he is having bowel movements he can have 3-4 bowel movements per day, there is urgency, explosiveness and fecal leakage and smearing.  This can make work difficult.  It affects his lifestyle.  At times stool also seems oily and fatty.  No blood in stool or melena. ? ? ?Review of Systems ?As per HPI, otherwise negative ? ?Current Medications, Allergies, Past Medical History, Past Surgical History, Family History and Social History were reviewed in [employer] record. ?   ?Objective:  ? Physical Exam ?BP (!) 144/92 (BP Location: Left Arm, Patient Position:  Sitting, Cuff Size: Normal)   Pulse 62   Ht '5\' 9"'$  (1.753 m)   Wt 208 lb 2 oz (94.4 kg)   BMI 30.73 kg/m?  ?Gen: awake, alert, NAD ?HEENT: anicteric ?CV: RRR, no mrg ?Pulm: CTA b/l ?Abd: soft, NT/ND, +BS throughout ?Ext: no c/c/e ?Neuro: nonfocal ? ?Care everywhere; recent CMP normal with exception of slightly low total protein at 5.6 ?TSH and free T4 normal ? ? ?   ?Assessment & Plan:  ?71 year old male with a history of adenomatous colon polyps, sigmoid diverticulosis, small hemorrhoids, chronic diarrhea, hypertension, hyperlipidemia, history of A-fib who is seen in follow-up to evaluate chronic diarrhea. ? ?Alternating bowel habits/fecal urgency/fatty stools --he did not respond to rifaximin which should have adequately treated SIBO.  I cannot fully explain why cefdinir on 2 occasions in the past have normalized his bowel habits for 6 months.  Cholestyramine/colestipol has not been helpful in the past.  After listening fully to his bowel pattern to me it sounds more like constipation with eventual overflow diarrhea (explosive stools occurring multiple times per day followed by 2 to 3 days without any bowel movement).  This seems to occur on a regular cycle.  There could be a component of EPI and this is worth a trial of Zenpep.  I recommended the following for now: ?--Begin Metamucil 2 heaping tablespoons daily; consider working this to twice daily if some but inadequate response --goal here is to bulk stool prevent more regularity without the risk of overflow diarrhea/emptying ?--Trial  of Zenpep 40,000 units, 2 capsules with meals 1 with snacks; samples provided; I asked that he try this for 10 to 14 days and let me know if it seems to be helping ?--Colonoscopy in mid May of this year for surveillance, see #2 but would also plan to exclude microscopic colitis ?--If pancreatic enzyme replacement and fiber supplementation does not help and colonoscopy is unrevealing for this symptom then I would consider  cefdinir course. ? ?2.  Personal history of adenomatous colon polyps --surveillance colonoscopy due this year.  We reviewed the risk, benefits and alternatives and he is agreeable and wishes to proceed. ? ?30 minutes total spent today including patient facing time, coordination of care, reviewing medical history/procedures/pertinent radiology studies, and documentation of the encounter. ? ? ?

## 2021-05-06 ENCOUNTER — Encounter: Payer: Self-pay | Admitting: Internal Medicine

## 2021-05-06 MED ORDER — ZENPEP 40000-126000 UNITS PO CPEP: ORAL_CAPSULE | ORAL | 0 refills | Status: DC

## 2021-06-13 ENCOUNTER — Encounter: Payer: Self-pay | Admitting: Internal Medicine

## 2021-06-13 ENCOUNTER — Telehealth: Payer: Self-pay

## 2021-06-13 NOTE — Telephone Encounter (Signed)
Left message for pt to call back. He sent a mychart message stating he is ton Eliquis '5mg'$  BID. He wanted to know if he needed to hold the eliquis for procedure on 5/26. Also sent pt a mychart message asking how long he has been on the eliquis and who the prescribing physician is for the Eliquis so we can request clearance.

## 2021-06-17 NOTE — Telephone Encounter (Signed)
Received clearance from [facility] Dr. Norm Salt to hold Eliquis for 2 days prior to procedure. Pt aware.

## 2021-06-17 NOTE — Telephone Encounter (Signed)
Yes 48 hr hold is what I prefer prior to colonoscopy if cleared by prescribing provider

## 2021-06-20 ENCOUNTER — Encounter: Payer: Self-pay | Admitting: Internal Medicine

## 2021-06-20 ENCOUNTER — Ambulatory Visit (AMBULATORY_SURGERY_CENTER): Payer: 59 | Admitting: Internal Medicine

## 2021-06-20 VITALS — BP 111/63 | HR 58 | Temp 97.3°F | Resp 10 | Ht 69.0 in | Wt 208.0 lb

## 2021-06-20 DIAGNOSIS — D124 Benign neoplasm of descending colon: Secondary | ICD-10-CM

## 2021-06-20 DIAGNOSIS — Z86010 Personal history of colonic polyps: Secondary | ICD-10-CM

## 2021-06-20 DIAGNOSIS — K635 Polyp of colon: Secondary | ICD-10-CM | POA: Diagnosis not present

## 2021-06-20 DIAGNOSIS — D120 Benign neoplasm of cecum: Secondary | ICD-10-CM

## 2021-06-20 DIAGNOSIS — D125 Benign neoplasm of sigmoid colon: Secondary | ICD-10-CM

## 2021-06-20 HISTORY — PX: COLONOSCOPY WITH PROPOFOL: SHX5780

## 2021-06-20 MED ORDER — SODIUM CHLORIDE 0.9 % IV SOLN: 500.0000 mL | Freq: Once | INTRAVENOUS | Status: DC

## 2021-06-20 NOTE — Progress Notes (Signed)
Called to room to assist during endoscopic procedure.  Patient ID and intended procedure confirmed with present staff. Received instructions for my participation in the procedure from the performing physician.  

## 2021-06-20 NOTE — Op Note (Addendum)
[name] Procedure Date: 06/20/2021 7:43 AM MRN: [id number] Endoscopist: Jerene Bears , MD Age: 71 Referring MD:  Date of Birth: November 30, 1950 Gender: Male Account #: [id number] Procedure:                Colonoscopy Indications:              High risk colon cancer surveillance: Personal                            history of multiple (3 or more) adenomas, Last                            colonoscopy: June 2020 Medicines:                Monitored Anesthesia Care Procedure:                Pre-Anesthesia Assessment:                           - Prior to the procedure, a History and Physical                            was performed, and patient medications and                            allergies were reviewed. The patient's tolerance of                            previous anesthesia was also reviewed. The risks                            and benefits of the procedure and the sedation                            options and risks were discussed with the patient.                            All questions were answered, and informed consent                            was obtained. Prior Anticoagulants: The patient has                            taken Eliquis (apixaban), last dose was 2 days                            prior to procedure. ASA Grade Assessment: II - A                            patient with mild systemic disease. After reviewing                            the risks and benefits, the patient was deemed in  satisfactory condition to undergo the procedure.                           After obtaining informed consent, the colonoscope                            was passed under direct vision. Throughout the                            procedure, the patient's blood pressure, pulse, and                            oxygen saturations were monitored continuously. The                            Olympus CF-[id number])  Colonoscope was                            introduced through the anus and advanced to the                            cecum, identified by appendiceal orifice and                            ileocecal valve. The colonoscopy was performed                            without difficulty. The patient tolerated the                            procedure well. The quality of the bowel                            preparation was good. The ileocecal valve,                            appendiceal orifice, and rectum were photographed. Scope In: 8:02:36 AM Scope Out: 8:18:41 AM Scope Withdrawal Time: 0 hours 13 minutes 6 seconds  Total Procedure Duration: 0 hours 16 minutes 5 seconds  Findings:                 Skin tag was found on perianal exam.                           A 3 mm polyp was found in the cecum. The polyp was                            sessile. The polyp was removed with a cold snare.                            Resection and retrieval were complete.                           A 3 mm polyp was found in the descending colon.  The                            polyp was sessile. The polyp was removed with a                            cold snare. Resection and retrieval were complete.                           A 4 mm polyp was found in the sigmoid colon. The                            polyp was sessile. The polyp was removed with a                            cold snare. Resection and retrieval were complete.                           Multiple small and large-mouthed diverticula were                            found in the sigmoid colon.                           The retroflexed view of the distal rectum and anal                            verge was normal and showed no anal or rectal                            abnormalities. Complications:            No immediate complications. Estimated Blood Loss:     Estimated blood loss was minimal. Impression:               - Perianal skin tag found on perianal  exam.                           - One 3 mm polyp in the cecum, removed with a cold                            snare. Resected and retrieved.                           - One 3 mm polyp in the descending colon, removed                            with a cold snare. Resected and retrieved.                           - One 4 mm polyp in the sigmoid colon, removed with                            a cold snare. Resected and  retrieved.                           - Diverticulosis in the sigmoid colon. Recommendation:           - Patient has a contact number available for                            emergencies. The signs and symptoms of potential                            delayed complications were discussed with the                            patient. Return to normal activities tomorrow.                            Written discharge instructions were provided to the                            patient.                           - Resume previous diet.                           - Continue present medications.                           - Await pathology results.                           - Resume Eliquis (apixaban) at prior dose today.                            Refer to managing physician for further adjustment                            of therapy.                           - Repeat colonoscopy in 5 years for surveillance. Jerene Bears, MD 06/20/2021 8:23:15 AM This report has been signed electronically.

## 2021-06-20 NOTE — Progress Notes (Signed)
GASTROENTEROLOGY PROCEDURE H&P NOTE   Primary Care Physician: Derinda Late, MD    Reason for Procedure:   Hx colon polyps  Plan:    Surveillance colonoscopy  Patient is appropriate for endoscopic procedure(s) in the ambulatory ([place]) setting.  The nature of the procedure, as well as the risks, benefits, and alternatives were carefully and thoroughly reviewed with the patient. Ample time for discussion and questions allowed. The patient understood, was satisfied, and agreed to proceed.     HPI: Phillip [name] is a 71 y.o. male who presents for surveillance colonoscopy.  Medical history as below.  Tolerated the prep.  No recent chest pain or shortness of breath.  No abdominal pain today.  Eliquis has been on hold.  Past Medical History:  Diagnosis Date   A-fib [facility])    Anxiety    Aortic atherosclerosis ([facility])    GERD (gastroesophageal reflux disease)    Heart murmur    Hyperlipidemia    Hypertension    Internal hemorrhoids    Lumbar herniated disc    MVP (mitral valve prolapse)    Tubular adenoma of colon     Past Surgical History:  Procedure Laterality Date   CARDIAC CATHETERIZATION     CHOLECYSTECTOMY, LAPAROSCOPIC  2002   COLONOSCOPY     KNEE SURGERY  2007 and 2010    Prior to Admission medications   Medication Sig Start Date End Date Taking? Authorizing Provider  ALPRAZolam (XANAX) 0.25 MG tablet Take 0.25 mg by mouth at bedtime.   Yes [provider]  apixaban (ELIQUIS) 5 MG TABS tablet Take 5 mg by mouth 2 (two) times daily.   Yes [provider]  cholecalciferol (VITAMIN D) 1000 UNITS tablet Take 2,000 Units by mouth daily.   Yes [provider]  esomeprazole (NEXIUM) 20 MG capsule Take 20 mg by mouth daily as needed (indigestion).   Yes [provider]  metoprolol succinate (TOPROL-XL) 25 MG 24 hr tablet Take 25 mg by mouth daily.   Yes [provider]  omega-3 acid ethyl esters (LOVAZA) 1 G capsule Take  1 g by mouth daily.   Yes [provider]  Pancrelipase, Lip-Prot-Amyl, (ZENPEP) 40000-126000 units CPEP Take 2 capsules with each meal (tid) and 1 capsule with snacks. 05/06/21  Yes Bing Duffey, Lajuan Lines, MD  rosuvastatin (CRESTOR) 5 MG tablet Take 5 mg by mouth daily.   Yes [provider]  valsartan (DIOVAN) 80 MG tablet Take 80 mg by mouth daily. 04/12/18  Yes [provider]  aspirin 325 MG tablet Take 325 mg by mouth daily. Patient not taking: Reported on 06/20/2021    [provider]    Current Outpatient Medications  Medication Sig Dispense Refill   ALPRAZolam (XANAX) 0.25 MG tablet Take 0.25 mg by mouth at bedtime.     apixaban (ELIQUIS) 5 MG TABS tablet Take 5 mg by mouth 2 (two) times daily.     cholecalciferol (VITAMIN D) 1000 UNITS tablet Take 2,000 Units by mouth daily.     esomeprazole (NEXIUM) 20 MG capsule Take 20 mg by mouth daily as needed (indigestion).     metoprolol succinate (TOPROL-XL) 25 MG 24 hr tablet Take 25 mg by mouth daily.     omega-3 acid ethyl esters (LOVAZA) 1 G capsule Take 1 g by mouth daily.     Pancrelipase, Lip-Prot-Amyl, (ZENPEP) 40000-126000 units CPEP Take 2 capsules with each meal (tid) and 1 capsule with snacks. 720 capsule 0   rosuvastatin (CRESTOR)  5 MG tablet Take 5 mg by mouth daily.     valsartan (DIOVAN) 80 MG tablet Take 80 mg by mouth daily.     aspirin 325 MG tablet Take 325 mg by mouth daily. (Patient not taking: Reported on 06/20/2021)     Current Facility-Administered Medications  Medication Dose Route Frequency Provider Last Rate Last Admin   0.9 %  sodium chloride infusion  500 mL Intravenous Once Reni Hausner, Lajuan Lines, MD        Allergies as of 06/20/2021   (No Known Allergies)    Family History  Problem Relation Age of Onset   Pulmonary fibrosis Mother    Heart attack Father    CAD Father    Colon polyps Sister    Colon cancer Paternal Aunt     Social History   Socioeconomic History   Marital  status: Married    Spouse name: Not on file   Number of children: Not on file   Years of education: Not on file   Highest education level: Not on file  Occupational History   Not on file  Tobacco Use   Smoking status: Never   Smokeless tobacco: Never  Vaping Use   Vaping Use: Never used  Substance and Sexual Activity   Alcohol use: No   Drug use: Never   Sexual activity: Not on file  Other Topics Concern   Not on file  Social History Narrative   Not on file   Social Determinants of Health   Financial Resource Strain: Not on file  Food Insecurity: Not on file  Transportation Needs: Not on file  Physical Activity: Not on file  Stress: Not on file  Social Connections: Not on file  Intimate Partner Violence: Not on file    Physical Exam: Vital signs in last 24 hours: '@BP'$  118/74   Pulse 62   Temp (!) 97.3 F (36.3 C)   Ht '5\' 9"'$  (1.753 m)   Wt 208 lb (94.3 kg)   SpO2 97%   BMI 30.72 kg/m  GEN: NAD EYE: Sclerae anicteric ENT: MMM CV: Non-tachycardic Pulm: CTA b/l GI: Soft, NT/ND NEURO:  Alert & Oriented x 3   Zenovia Jarred, MD [facility]  06/20/2021 7:53 AM

## 2021-06-20 NOTE — Patient Instructions (Addendum)
Impression/Recommendations:  Polyp and diverticulosis handouts given to patient.  Resume previous diet. Continue present medications. Await pathology results.  Resume Eliquis this evening.  Repeat colonoscopy in 5 years for surveillance.  YOU HAD AN ENDOSCOPIC PROCEDURE TODAY AT [facility]:   Refer to the procedure report that was given to you for any specific questions about what was found during the examination.  If the procedure report does not answer your questions, please call your gastroenterologist to clarify.  If you requested that your care partner not be given the details of your procedure findings, then the procedure report has been included in a sealed envelope for you to review at your convenience later.  YOU SHOULD EXPECT: Some feelings of bloating in the abdomen. Passage of more gas than usual.  Walking can help get rid of the air that was put into your GI tract during the procedure and reduce the bloating. If you had a lower endoscopy (such as a colonoscopy or flexible sigmoidoscopy) you may notice spotting of blood in your stool or on the toilet paper. If you underwent a bowel prep for your procedure, you may not have a normal bowel movement for a few days.  Please Note:  You might notice some irritation and congestion in your nose or some drainage.  This is from the oxygen used during your procedure.  There is no need for concern and it should clear up in a day or so.  SYMPTOMS TO REPORT IMMEDIATELY:  Following lower endoscopy (colonoscopy or flexible sigmoidoscopy):  Excessive amounts of blood in the stool  Significant tenderness or worsening of abdominal pains  Swelling of the abdomen that is new, acute  Fever of 100F or higher  For urgent or emergent issues, a gastroenterologist can be reached at any hour by calling [phone]. Do not use MyChart messaging for urgent concerns.    DIET:  We do recommend a small meal at first, but then you may  proceed to your regular diet.  Drink plenty of fluids but you should avoid alcoholic beverages for 24 hours.  ACTIVITY:  You should plan to take it easy for the rest of today and you should NOT DRIVE or use heavy machinery until tomorrow (because of the sedation medicines used during the test).    FOLLOW UP: Our staff will call the number listed on your records 48-72 hours following your procedure to check on you and address any questions or concerns that you may have regarding the information given to you following your procedure. If we do not reach you, we will leave a message.  We will attempt to reach you two times.  During this call, we will ask if you have developed any symptoms of COVID 19. If you develop any symptoms (ie: fever, flu-like symptoms, shortness of breath, cough etc.) before then, please call [phone].  If you test positive for Covid 19 in the 2 weeks post procedure, please call and report this information to [place].    If any biopsies were taken you will be contacted by phone or by letter within the next 1-3 weeks.  Please call us at [phone] if you have not heard about the biopsies in 3 weeks.    SIGNATURES/CONFIDENTIALITY: You and/or your care partner have signed paperwork which will be entered into your electronic medical record.  These signatures attest to the fact that that the information above on your After Visit Summary has been reviewed and is understood.  Full responsibility  of the confidentiality of this discharge information lies with you and/or your care-partner.

## 2021-06-20 NOTE — Progress Notes (Signed)
To pacu, VSS. Report to [email or web address].tb 

## 2021-06-24 ENCOUNTER — Telehealth: Payer: Self-pay

## 2021-06-24 NOTE — Telephone Encounter (Signed)
Left message on answering machine. 

## 2021-06-27 ENCOUNTER — Encounter: Payer: Self-pay | Admitting: Internal Medicine

## 2021-07-26 ENCOUNTER — Emergency Department (HOSPITAL_BASED_OUTPATIENT_CLINIC_OR_DEPARTMENT_OTHER)
Admission: EM | Admit: 2021-07-26 | Discharge: 2021-07-27 | Disposition: A | Discharge status: Home / Self Care | Payer: 59 | Attending: Emergency Medicine | Admitting: Emergency Medicine

## 2021-07-26 ENCOUNTER — Emergency Department (HOSPITAL_BASED_OUTPATIENT_CLINIC_OR_DEPARTMENT_OTHER): Payer: 59

## 2021-07-26 ENCOUNTER — Encounter (HOSPITAL_BASED_OUTPATIENT_CLINIC_OR_DEPARTMENT_OTHER): Payer: Self-pay

## 2021-07-26 ENCOUNTER — Other Ambulatory Visit: Payer: Self-pay

## 2021-07-26 DIAGNOSIS — Z7901 Long term (current) use of anticoagulants: Secondary | ICD-10-CM | POA: Diagnosis not present

## 2021-07-26 DIAGNOSIS — I10 Essential (primary) hypertension: Secondary | ICD-10-CM | POA: Diagnosis not present

## 2021-07-26 DIAGNOSIS — R109 Unspecified abdominal pain: Secondary | ICD-10-CM | POA: Diagnosis present

## 2021-07-26 DIAGNOSIS — Z7982 Long term (current) use of aspirin: Secondary | ICD-10-CM | POA: Diagnosis not present

## 2021-07-26 DIAGNOSIS — Z79899 Other long term (current) drug therapy: Secondary | ICD-10-CM | POA: Insufficient documentation

## 2021-07-26 DIAGNOSIS — K529 Noninfective gastroenteritis and colitis, unspecified: Secondary | ICD-10-CM | POA: Insufficient documentation

## 2021-07-26 DIAGNOSIS — R1033 Periumbilical pain: Secondary | ICD-10-CM | POA: Diagnosis present

## 2021-07-26 LAB — CBC
HCT: 48.9 % (ref 39.0–52.0)
Hemoglobin: 16.9 g/dL (ref 13.0–17.0)
MCH: 30 pg (ref 26.0–34.0)
MCHC: 34.6 g/dL (ref 30.0–36.0)
MCV: 86.9 fL (ref 80.0–100.0)
Platelets: 180 10*3/uL (ref 150–400)
RBC: 5.63 MIL/uL (ref 4.22–5.81)
RDW: 12.8 % (ref 11.5–15.5)
WBC: 8.2 10*3/uL (ref 4.0–10.5)
nRBC: 0 % (ref 0.0–0.2)

## 2021-07-26 LAB — URINALYSIS, ROUTINE W REFLEX MICROSCOPIC
Bilirubin Urine: NEGATIVE
Glucose, UA: NEGATIVE mg/dL
Hgb urine dipstick: NEGATIVE
Ketones, ur: NEGATIVE mg/dL
Leukocytes,Ua: NEGATIVE
Nitrite: NEGATIVE
Protein, ur: NEGATIVE mg/dL
Specific Gravity, Urine: 1.011 (ref 1.005–1.030)
pH: 5.5 (ref 5.0–8.0)

## 2021-07-26 LAB — COMPREHENSIVE METABOLIC PANEL
ALT: 16 U/L (ref 0–44)
AST: 15 U/L (ref 15–41)
Albumin: 4.4 g/dL (ref 3.5–5.0)
Alkaline Phosphatase: 79 U/L (ref 38–126)
Anion gap: 8 (ref 5–15)
BUN: 13 mg/dL (ref 8–23)
CO2: 28 mmol/L (ref 22–32)
Calcium: 10 mg/dL (ref 8.9–10.3)
Chloride: 100 mmol/L (ref 98–111)
Creatinine, Ser: 0.98 mg/dL (ref 0.61–1.24)
GFR, Estimated: >60 mL/min (ref >60)
Glucose, Bld: 129 mg/dL — ABNORMAL HIGH (ref 70–99)
Potassium: 3.9 mmol/L (ref 3.5–5.1)
Sodium: 136 mmol/L (ref 135–145)
Total Bilirubin: 0.7 mg/dL (ref 0.3–1.2)
Total Protein: 7.8 g/dL (ref 6.5–8.1)

## 2021-07-26 LAB — LIPASE, BLOOD: Lipase: 34 U/L (ref 11–51)

## 2021-07-26 MED ORDER — MORPHINE SULFATE (PF) 4 MG/ML IV SOLN
4.0000 mg | Freq: Once | INTRAVENOUS | Status: AC
  Administered 2021-07-26: 4 mg via INTRAVENOUS
  Filled 2021-07-26: qty 1

## 2021-07-26 MED ORDER — ONDANSETRON HCL 4 MG/2ML IJ SOLN
4.0000 mg | Freq: Once | INTRAMUSCULAR | Status: AC
  Administered 2021-07-26: 4 mg via INTRAVENOUS
  Filled 2021-07-26: qty 2

## 2021-07-26 MED ORDER — IOHEXOL 300 MG/ML  SOLN
100.0000 mL | Freq: Once | INTRAMUSCULAR | Status: AC | PRN
  Administered 2021-07-26: 100 mL via INTRAVENOUS

## 2021-07-26 MED ORDER — ALUM & MAG HYDROXIDE-SIMETH 200-200-20 MG/5ML PO SUSP
30.0000 mL | Freq: Once | ORAL | Status: AC
  Administered 2021-07-26: 30 mL via ORAL
  Filled 2021-07-26: qty 30

## 2021-07-26 MED ORDER — FENTANYL CITRATE PF 50 MCG/ML IJ SOSY
75.0000 ug | PREFILLED_SYRINGE | Freq: Once | INTRAMUSCULAR | Status: AC
  Administered 2021-07-26: 75 ug via INTRAVENOUS
  Filled 2021-07-26: qty 2

## 2021-07-26 MED ORDER — HYDROMORPHONE HCL 1 MG/ML IJ SOLN
0.5000 mg | Freq: Once | INTRAMUSCULAR | Status: AC
  Administered 2021-07-26: 0.5 mg via INTRAVENOUS
  Filled 2021-07-26: qty 1

## 2021-07-26 MED ORDER — MORPHINE SULFATE (PF) 4 MG/ML IV SOLN
8.0000 mg | Freq: Once | INTRAVENOUS | Status: AC
  Administered 2021-07-26: 8 mg via INTRAVENOUS
  Filled 2021-07-26: qty 2

## 2021-07-26 MED ORDER — HYOSCYAMINE SULFATE 0.125 MG SL SUBL
0.2500 mg | SUBLINGUAL_TABLET | Freq: Once | SUBLINGUAL | Status: AC
  Administered 2021-07-26: 0.25 mg via SUBLINGUAL
  Filled 2021-07-26: qty 2

## 2021-07-26 MED ORDER — METOCLOPRAMIDE HCL 5 MG/ML IJ SOLN
10.0000 mg | Freq: Once | INTRAMUSCULAR | Status: AC
  Administered 2021-07-26: 10 mg via INTRAVENOUS
  Filled 2021-07-26: qty 2

## 2021-07-26 NOTE — ED Notes (Signed)
Patient sent to CT in no acute distress.  

## 2021-07-26 NOTE — ED Provider Notes (Signed)
[facility] EMERGENCY DEPT Provider Note   CSN: [id number] Arrival date & time: 07/26/21  1518     History  Chief Complaint  Patient presents with   Abdominal Pain    Phillip [name] is a 71 y.o. male.  Patient presents to the hospital complaining of periumbilical pain that began yesterday.  Pain has been mostly constant since it began.  Patient also has moderate nausea.  Denies emesis, diarrhea, fever.  Patient states that the pain has radiated from the umbilical region to the lower abdominal region.  Patient does have history of diverticulosis but has never had previous diverticulitis.  Patient denies shortness of breath or chest pain.  Past medical history significant for GERD, mitral valve prolapse, hyperlipidemia, history of A-fib, anxiety, hypertension, possible diverticulosis  HPI     Home Medications Prior to Admission medications   Medication Sig Start Date End Date Taking? Authorizing Provider  ALPRAZolam (XANAX) 0.25 MG tablet Take 0.25 mg by mouth at bedtime.    [provider]  apixaban (ELIQUIS) 5 MG TABS tablet Take 5 mg by mouth 2 (two) times daily.    [provider]  aspirin 325 MG tablet Take 325 mg by mouth daily. Patient not taking: Reported on 06/20/2021    [provider]  cholecalciferol (VITAMIN D) 1000 UNITS tablet Take 2,000 Units by mouth daily.    [provider]  esomeprazole (NEXIUM) 20 MG capsule Take 20 mg by mouth daily as needed (indigestion).    [provider]  metoprolol succinate (TOPROL-XL) 25 MG 24 hr tablet Take 25 mg by mouth daily.    [provider]  omega-3 acid ethyl esters (LOVAZA) 1 G capsule Take 1 g by mouth daily.    [provider]  Pancrelipase, Lip-Prot-Amyl, (ZENPEP) 40000-126000 units CPEP Take 2 capsules with each meal (tid) and 1 capsule with snacks. 05/06/21   Pyrtle, Lajuan Lines, MD  rosuvastatin (CRESTOR) 5 MG tablet Take 5 mg by mouth daily.     [provider]  valsartan (DIOVAN) 80 MG tablet Take 80 mg by mouth daily. 04/12/18   [provider]      Allergies    Patient has no known allergies.    Review of Systems   Review of Systems  Constitutional:  Negative for fever.  Respiratory:  Negative for shortness of breath.   Cardiovascular:  Negative for chest pain.  Gastrointestinal:  Positive for abdominal pain and nausea. Negative for blood in stool, constipation, diarrhea and vomiting.  Genitourinary:  Negative for dysuria.    Physical Exam Updated Vital Signs BP (!) 151/93   Pulse 63   Temp 98.3 F (36.8 C)   Resp 16   Ht '5\' 9"'$  (1.753 m)   Wt 94.3 kg   SpO2 95%   BMI 30.70 kg/m  Physical Exam Vitals and nursing note reviewed.  Constitutional:      General: He is not in acute distress. HENT:     Head: Normocephalic and atraumatic.     Mouth/Throat:     Mouth: Mucous membranes are moist.  Eyes:     Extraocular Movements: Extraocular movements intact.  Cardiovascular:     Rate and Rhythm: Normal rate and regular rhythm.     Heart sounds: Normal heart sounds.  Pulmonary:     Effort: Pulmonary effort is normal.     Breath sounds: Normal breath sounds.  Abdominal:     General: Abdomen is flat. Bowel sounds are normal.  Palpations: Abdomen is soft.     Tenderness: There is abdominal tenderness in the right lower quadrant, periumbilical area and suprapubic area.  Skin:    General: Skin is warm and dry.     Capillary Refill: Capillary refill takes less than 2 seconds.     Coloration: Skin is not jaundiced or pale.  Neurological:     Mental Status: He is alert.     ED Results / Procedures / Treatments   Labs (all labs ordered are listed, but only abnormal results are displayed) Labs Reviewed  COMPREHENSIVE METABOLIC PANEL - Abnormal; Notable for the following components:      Result Value   Glucose, Bld 129 (*)    All other components within normal limits  LIPASE, BLOOD  CBC   URINALYSIS, ROUTINE W REFLEX MICROSCOPIC    EKG None  Radiology CT Abdomen Pelvis Wo Contrast  Result Date: 07/26/2021 CLINICAL DATA:  Acute nonlocalized abdominal pain. EXAM: CT ABDOMEN AND PELVIS WITHOUT CONTRAST TECHNIQUE: Multidetector CT imaging of the abdomen and pelvis was performed following the standard protocol without IV contrast. RADIATION DOSE REDUCTION: This exam was performed according to the departmental dose-optimization program which includes automated exposure control, adjustment of the mA and/or kV according to patient size and/or use of iterative reconstruction technique. COMPARISON:  CT 02/13/2020 FINDINGS: Lower chest: No acute airspace disease or pleural effusion. Heart is normal in size. Distal esophagus is patulous with small amount of ingested material. Hepatobiliary: No focal liver lesion. Post cholecystectomy with slight intrahepatic biliary prominence. Normal common bile duct. Pancreas: Unremarkable. No pancreatic ductal dilatation or surrounding inflammatory changes. Spleen: Normal in size without focal abnormality. Splenule inferiorly, incidental Adrenals/Urinary Tract: No adrenal nodule. 7 mm nonobstructing stone in the upper left kidney. No hydronephrosis. Homogeneous renal enhancement with symmetric excretion on delayed phase imaging. Partially distended urinary bladder, unremarkable for degree of distension. Stomach/Bowel: The stomach is distended with fluid and enteric contrast. Small amount of enteric contrast in the distal esophagus which is patulous. Dilated proximal small bowel measuring up to 4 cm in dimension. Fecalization of small bowel contents in the central abdomen. There is gradual transition from fecalized dilated small bowel to less dilated but persistent fecalized content in the central upper abdomen, series 3, image 33. Slight small bowel and mesenteric hyperemia in the region of transition, but no wall thickening, evidence of small bowel lesion or  structure. The more distal small bowel is decompressed. Diminutive appendix tentatively visualized. Small to moderate volume of colonic stool. Short segment of colon at the descending sigmoid junction extends into the left inguinal canal, no associated wall thickening or obstruction. Vascular/Lymphatic: Aortic atherosclerosis without aneurysm. Patent portal, splenic, and mesenteric veins. Retroaortic left renal vein. No enlarged lymph nodes in the abdomen or pelvis. Reproductive: Prostate gland spans 5.1 cm transverse. Other: Left inguinal hernia contains fat, with a short segment of colon extending to the inguinal ring. Minimal fat in the right inguinal canal. There is trace free fluid in the mesentery and pelvis. No free air or focal fluid collection. Musculoskeletal: Prominent Schmorl's node within L3 superior endplate, unchanged. Mild lumbar degenerative change. No acute osseous abnormalities. IMPRESSION: 1. Dilated proximal small bowel with gradual transition distally. There is no discrete transition point, however mild small bowel and mesenteric hyperemia in the region of gradual transition. Suspect an element of enteritis causing proximal stasis. Similar findings were seen on 2022 exam, raising possibility for a CT occult small bowel process. Consider small bowel enterography after resolution of  acute event. 2. Small amount of mesenteric and pelvic free fluid is likely reactive. 3. Left inguinal hernia contains fat, with a short segment of colon extending to the inguinal ring, no associated wall thickening or obstruction. 4. Nonobstructing left renal stone. 5. Prominent prostate gland. Aortic Atherosclerosis (ICD10-I70.0). Electronically Signed   By: Keith Rake M.D.   On: 07/26/2021 20:16    Procedures Procedures    Medications Ordered in ED Medications  fentaNYL (SUBLIMAZE) injection 75 mcg (75 mcg Intravenous Given 07/26/21 1739)  ondansetron (ZOFRAN) injection 4 mg (4 mg Intravenous Given  07/26/21 1739)  morphine (PF) 4 MG/ML injection 4 mg (4 mg Intravenous Given 07/26/21 1856)  ondansetron (ZOFRAN) injection 4 mg (4 mg Intravenous Given 07/26/21 1913)  iohexol (OMNIPAQUE) 300 MG/ML solution 100 mL (100 mLs Intravenous Contrast Given 07/26/21 1946)  metoCLOPramide (REGLAN) injection 10 mg (10 mg Intravenous Given 07/26/21 2045)  morphine (PF) 4 MG/ML injection 8 mg (8 mg Intravenous Given 07/26/21 2105)    ED Course/ Medical Decision Making/ A&P                           Medical Decision Making Amount and/or Complexity of Data Reviewed Labs: ordered. Radiology: ordered.  Risk Prescription drug management. Decision regarding hospitalization.   Patient presents with a chief complaint of abdominal pain.  Differential includes but is not limited to appendicitis, pancreatitis, diverticulitis, cholecystitis, gastroenteritis, and others  Reviewed patient's medical history and charts.  From documentation patient taking Eliquis for A-fib and recent colonoscopy  I ordered and reviewed lab results.  Pertinent results include glucose 129, lipase 34, grossly normal urinalysis, grossly normal CBC  I ordered and personally interpreted imaging including a CT abdomen pelvis without contrast. 1. Dilated proximal small bowel with gradual transition distally. There is no discrete transition point, however mild small bowel and mesenteric hyperemia in the region of gradual transition. Suspect an element of enteritis causing proximal stasis. Similar findings were seen on 2022 exam, raising possibility for a CT occult small bowel process. Consider small bowel enterography after resolution of acute event. 2. Small amount of mesenteric and pelvic free fluid is likely reactive. 3. Left inguinal hernia contains fat, with a short segment of colon extending to the inguinal ring, no associated wall thickening or obstruction. 4. Nonobstructing left renal stone. 5. Prominent prostate gland.  I agree  with radiologist findings  I ordered the patient fentanyl and morphine for pain and Zofran for nausea.  Reglan for paresis.  Patient continued to have significant pain  Requested consultation with the hospitalist. Dr. Myna Hidalgo agrees to admit patient for continued pain control         Final Clinical Impression(s) / ED Diagnoses Final diagnoses:  Enteritis    Rx / DC Orders ED Discharge Orders     None         Ronny Bacon 07/26/21 2142    Lacretia Leigh, MD 07/27/21 1600

## 2021-07-26 NOTE — ED Triage Notes (Signed)
Patient here POV from Home.  Endorses ABD Pain. Mainly located to Umbilical Region and Lower ABD. Began last PM and remained Constant since it began.   Moderate Nausea. No Emesis. History of Diverticulosis. No Diarrhea. Regular BM. No Confirmed Fevers.  NAD Noted during Triage. A&Ox4. GCS 15. Ambulatory.

## 2021-07-27 ENCOUNTER — Telehealth (HOSPITAL_BASED_OUTPATIENT_CLINIC_OR_DEPARTMENT_OTHER): Payer: Self-pay | Admitting: Emergency Medicine

## 2021-07-27 MED ORDER — HYOSCYAMINE SULFATE SL 0.125 MG SL SUBL: 1.0000 | SUBLINGUAL_TABLET | Freq: Four times a day (QID) | SUBLINGUAL | 0 refills | Status: DC | PRN

## 2021-07-27 MED ORDER — ONDANSETRON 4 MG PO TBDP
4.0000 mg | ORAL_TABLET | Freq: Three times a day (TID) | ORAL | 0 refills | Status: AC | PRN
Start: 2021-07-27 — End: 2021-07-30

## 2021-07-27 MED ORDER — LIDOCAINE VISCOUS HCL 2 % MT SOLN: 10.0000 mL | Freq: Four times a day (QID) | OROMUCOSAL | 0 refills | Status: DC | PRN

## 2021-07-27 MED ORDER — KETOROLAC TROMETHAMINE 15 MG/ML IJ SOLN
15.0000 mg | Freq: Once | INTRAMUSCULAR | Status: AC
  Administered 2021-07-27: 15 mg via INTRAVENOUS
  Filled 2021-07-27: qty 1

## 2021-07-27 MED ORDER — ONDANSETRON 4 MG PO TBDP: 4.0000 mg | ORAL_TABLET | Freq: Three times a day (TID) | ORAL | 0 refills | Status: DC | PRN

## 2021-07-27 NOTE — ED Provider Notes (Signed)
Patient being admitted for enteritis requiring pain control. During my shift he was provided with additional symptomatic management which resulted in significant relief. Patient opted to be discharged instead of admission.  I believe this is appropriate as he is not septic and tolerating PO intake with better pain control.  The patient appears reasonably screened and/or stabilized for discharge and I doubt any other medical condition or other [facility] requiring further screening, evaluation, or treatment in the ED at this time prior to discharge. Safe for discharge with strict return precautions.  Disposition: Discharge  Condition: Good  I have discussed the results, Dx and Tx plan with the patient/family who expressed understanding and agree(s) with the plan. Discharge instructions discussed at length. The patient/family was given strict return precautions who verbalized understanding of the instructions. No further questions at time of discharge.    ED Discharge Orders          Ordered    lidocaine (XYLOCAINE) 2 % solution  Every 6 hours PRN        07/27/21 0538    Hyoscyamine Sulfate SL (LEVSIN/SL) 0.125 MG SUBL  4 times daily PRN        07/27/21 0538    ondansetron (ZOFRAN-ODT) 4 MG disintegrating tablet  Every 8 hours PRN        07/27/21 0538                Fatima Blank, MD 07/27/21 [phone]

## 2021-09-18 ENCOUNTER — Ambulatory Visit: Payer: Self-pay | Admitting: Surgery

## 2021-09-30 ENCOUNTER — Encounter (HOSPITAL_BASED_OUTPATIENT_CLINIC_OR_DEPARTMENT_OTHER): Payer: Self-pay | Admitting: Surgery

## 2021-09-30 NOTE — Progress Notes (Signed)
Spoke w/ via phone for pre-op interview--- pt Lab needs dos---- [employer] ,  ekg              Lab results------ no COVID test -----patient states asymptomatic no test needed Arrive at -------  1215 on 10-06-2021 NPO after MN NO Solid Food.  Clear liquids from MN until--- 1115 Med rec completed Medications to take morning of surgery ----- toprol Diabetic medication ----- n/a Patient instructed no nail polish to be worn day of surgery Patient instructed to bring photo id and insurance card day of surgery Patient aware to have Driver (ride ) / caregiver  for 24 hours after surgery --- wife, cassandra Patient Special Instructions ----- n/a Pre-Op special Istructions ----- pt has cardiac clearance letter from Dr Norm Salt dated 09-19-2021 , received via fax from Dr Harlow Asa office, placed in chart. Patient verbalized understanding of instructions that were given at this phone interview. Patient denies shortness of breath, chest pain, fever, cough at this phone interview.    Anesthesia Review:  HTN;  PAF;  mild MVP  PCP:  Dr Sandi Mariscal Cardiologist : primary , Dr Viona Gilmore. Thornell Mule (lov 05-21-2021 epic) EP cardiology, Dr Norm Salt (lov 08-22-2020 epic) EKG :   08-22-2020 reading in care everywhere Echo :  02-02-2020 epic Stress test: nuclear 06-12-2013 epic Cardiac Cath : not available done @ [facility] Activity level:  denies sob w/ any activity  Blood Thinner/ Instructions Maryjane Hurter Dose: Eliquis ASA / Instructions/ Last Dose :   no Per pt given instructions by cardiology to stop 2 days prior to surgery, pt stated he will stop 10-02-2021

## 2021-10-03 ENCOUNTER — Encounter (HOSPITAL_BASED_OUTPATIENT_CLINIC_OR_DEPARTMENT_OTHER): Payer: Self-pay | Admitting: Surgery

## 2021-10-03 DIAGNOSIS — K4090 Unilateral inguinal hernia, without obstruction or gangrene, not specified as recurrent: Secondary | ICD-10-CM

## 2021-10-03 NOTE — H&P (Signed)
REFERRING PHYSICIAN: Self  PROVIDER: Salene Mohamud Charlotta Newton, MD   Chief Complaint: New Consultation (Left inguinal hernia)  History of Present Illness:  Phillip [name] is referred by Dr. Derinda Late for surgical evaluation and management of left inguinal hernia. Patient first noted symptoms approximately 6 months ago. He has seen a gradual enlargement in the bulge in the left groin. He did have an episode in July of abdominal pain which required evaluation in the emergency department including a CT scan of the abdomen and pelvis. This demonstrated a left inguinal hernia containing a small amount of colon. There was also some change in the small intestine worrisome for enteritis. Patient has had a previous laparoscopic cholecystectomy. He has had no prior hernia repairs. 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 He presents today to discuss hernia repair.  Patient does have a cardiac history with atrial fibrillation. He is on Eliquis.  Review of Systems: A complete review of systems was obtained from the patient. I have reviewed this information and discussed as appropriate with the patient. See HPI as well for other ROS.  Review of Systems  Constitutional: Negative.  HENT: Negative.  Eyes: Negative.  Respiratory: Negative.  Cardiovascular: Negative.  Gastrointestinal: Positive for abdominal pain.  Genitourinary:  Bulge left groin  Musculoskeletal: Negative.  Skin: Negative.  Neurological: Negative.  Endo/Heme/Allergies: Negative.  Psychiatric/Behavioral: Negative.   Medical History: Past Medical History:  Diagnosis Date  Arrhythmia  GERD (gastroesophageal reflux disease) 2000  Hyperlipidemia 04/08/2020  Hypertension 2000  Osteoarthritis 2008   Patient Active Problem List  Diagnosis  Atherosclerosis of aorta (CMS-HCC)  Essential hypertension  Gastro-esophageal reflux disease without esophagitis  Mixed hyperlipidemia  Mitral valve prolapse  Palpitations   Paroxysmal atrial fibrillation (CMS-HCC)  Screening for ischemic heart disease (IHD)  Left inguinal hernia   Past Surgical History:  Procedure Laterality Date  CHOLECYSTECTOMY 2004  CARDIAC CATHETERIZATION  KNEE ARTHROSCOPY [phone]    No Known Allergies  Current Outpatient Medications on File Prior to Visit  Medication Sig Dispense Refill  ALPRAZolam (XANAX) 0.25 MG tablet Take 0.25 mg by mouth 2 (two) times daily as needed  cholecalciferol (VITAMIN D3) 1000 unit tablet Take 2,000 Units by mouth once daily  ELIQUIS 5 mg tablet TAKE 1 TABLET(5 MG) BY MOUTH EVERY 12 HOURS 180 tablet 3  esomeprazole (NEXIUM) 40 MG DR capsule TK 1 C PO 5 DAYS A WK  fluticasone propionate (FLONASE) 50 mcg/actuation nasal spray by Nasal route  HYDROcodone-acetaminophen (NORCO) 5-325 mg tablet TAKE 1/2 TABLET BY MOUTH AS NEEDED FOR PAIN EVERY 6 HOURS AS NEEDED  metoprolol succinate (TOPROL-XL) 25 MG XL tablet Take 25 mg by mouth once daily  omega-3 acid ethyl esters (LOVAZA) 1 gram capsule Take 1 g by mouth 2 (two) times daily  rosuvastatin (CRESTOR) 5 MG tablet Take 5 mg by mouth once daily  valsartan (DIOVAN) 80 MG tablet Take 120 mg by mouth once daily Take 40 mg in the AM and 80 in the PM, by mouth, daily   No current facility-administered medications on file prior to visit.   Family History  Problem Relation Age of Onset  High blood pressure (Hypertension) Mother  Depression Mother  Pulmonary fibrosis Mother  Heart disease Father  High blood pressure (Hypertension) Father  Coronary Artery Disease (Blocked arteries around heart) Father  Myocardial Infarction (Heart attack) Father  Atrial fibrillation (Abnormal heart rhythm sometimes requiring treatment with blood thinners) Father  Atrial fibrillation (Abnormal heart rhythm sometimes  requiring treatment with blood thinners) Sister    Social History   Tobacco Use  Smoking Status Never  Smokeless Tobacco Never    Social History    Socioeconomic History  Marital status: Married  Number of children: 2  Highest education level: Master's degree (e.g., MA, MS, MEng, MEd, MSW, MBA)  Occupational History  Occupation: Optometrist  Comment: Works part-time  Tobacco Use  Smoking status: Never  Smokeless tobacco: Never  Surveyor, mining Use: Never used  Substance and Sexual Activity  Alcohol use: Never  Drug use: Never  Sexual activity: Yes  Partners: Female  Social History Narrative  Grew up in [place], [place]. 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 Has continued to the present time. 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 Married 2 children.   Social Determinants of Health   Financial Resource Strain: Low Risk  Difficulty of Paying Living Expenses: Not hard at all  Food Insecurity: No Food Insecurity  Worried About Charity fundraiser in the Last Year: Never true  [place] in the Last Year: Never true  Transportation Needs: No Transportation Needs  Lack of Transportation (Medical): No  Lack of Transportation (Non-Medical): No  Physical Activity: Sufficiently Active  Days of Exercise per Week: 4 days  Minutes of Exercise per Session: 60 min  Stress: No Stress Concern Present  Feeling of Stress : Only a little  Social Connections: Unknown  Frequency of Communication with Friends and Family: More than three times a week  Attends Archivist Meetings: More than 4 times per year  Marital Status: Married  Housing Stability: Unknown  Unable to Pay for Housing in the Last Year: No  Unstable Housing in the Last Year: No   Objective:   Vitals:   BP: 110/70  Pulse: 90  Temp: 36.6 C (97.8 F)  SpO2: 97%  Weight: 88.1 kg (194 lb 3.2 oz)  Height: 172.7 cm ('5\' 8"'$ )   Body mass index is 29.53 kg/m.  Physical Exam   GENERAL APPEARANCE Comfortable, no acute issues Development: normal Gross deformities: none  SKIN Rash, lesions,  ulcers: none Induration, erythema: none Nodules: none palpable  EYES Conjunctiva and lids: normal Pupils: equal and reactive  EARS, NOSE, MOUTH, THROAT External ears: no lesion or deformity External nose: no lesion or deformity Hearing: grossly normal  NECK Symmetric: yes Trachea: midline Thyroid: no palpable nodules in the thyroid bed  CHEST Respiratory effort: normal Retraction or accessory muscle use: no Breath sounds: normal bilaterally Rales, rhonchi, wheeze: none  CARDIOVASCULAR Auscultation: regular rhythm, normal rate Murmurs: none Pulses: radial pulse 2+ palpable Lower extremity edema: none  ABDOMEN Abdomen is soft without distention. Well-healed surgical incisions. No sign of umbilical hernia.  GENITOURINARY/RECTAL Normal male genitalia without mass or lesion. Palpation in the right inguinal canal with cough and Valsalva shows no sign of hernia. There is a visible bulge in the left groin. Palpation shows a moderate size left inguinal hernia which is mildly tender to palpation. It is reducible. It spontaneously prolapses and augments with cough and Valsalva.  MUSCULOSKELETAL Station and gait: normal Digits and nails: no clubbing or cyanosis Muscle strength: grossly normal all extremities Range of motion: grossly normal all extremities Deformity: none  LYMPHATIC Cervical: none palpable Supraclavicular: none palpable  PSYCHIATRIC Oriented to person, place, and time: yes Mood and affect: normal for situation Judgment and insight: appropriate for situation   Assessment and  Plan:   Left inguinal hernia  Patient presents on referral from his primary care physician for surgical evaluation and management of a left inguinal hernia. Patient is provided with written literature on hernia surgery to review at home.  Patient has a moderate size left inguinal hernia which is mildly to moderately symptomatic. He will require repair. Today we discussed the options  for surgical management. I have recommended open left inguinal hernia repair with mesh as the procedure of choice. This has the lowest recurrence rate of approximately 2%. We discussed the size and location of the surgical incision. We discussed the restrictions on his activities following the procedure. We discussed his postoperative recovery and return to work and activities. The patient understands and wishes to proceed with surgery in the near future.   Armandina Gemma, MD [facility] A [place] practice Office: [phone]

## 2021-10-06 ENCOUNTER — Ambulatory Visit (HOSPITAL_BASED_OUTPATIENT_CLINIC_OR_DEPARTMENT_OTHER)
Admission: RE | Admit: 2021-10-06 | Discharge: 2021-10-06 | Disposition: A | Discharge status: Home / Self Care | Payer: 59 | Attending: Surgery | Admitting: Surgery

## 2021-10-06 ENCOUNTER — Encounter (HOSPITAL_BASED_OUTPATIENT_CLINIC_OR_DEPARTMENT_OTHER): Payer: Self-pay | Admitting: Surgery

## 2021-10-06 ENCOUNTER — Other Ambulatory Visit: Payer: Self-pay

## 2021-10-06 ENCOUNTER — Ambulatory Visit (HOSPITAL_BASED_OUTPATIENT_CLINIC_OR_DEPARTMENT_OTHER): Payer: 59 | Admitting: Anesthesiology

## 2021-10-06 ENCOUNTER — Encounter (HOSPITAL_BASED_OUTPATIENT_CLINIC_OR_DEPARTMENT_OTHER)
Admission: RE | Disposition: A | Discharge status: Home / Self Care | Payer: Self-pay | Source: Home / Self Care | Attending: Surgery

## 2021-10-06 DIAGNOSIS — Z7901 Long term (current) use of anticoagulants: Secondary | ICD-10-CM | POA: Diagnosis not present

## 2021-10-06 DIAGNOSIS — Z9049 Acquired absence of other specified parts of digestive tract: Secondary | ICD-10-CM | POA: Diagnosis not present

## 2021-10-06 DIAGNOSIS — I10 Essential (primary) hypertension: Secondary | ICD-10-CM | POA: Insufficient documentation

## 2021-10-06 DIAGNOSIS — K4090 Unilateral inguinal hernia, without obstruction or gangrene, not specified as recurrent: Secondary | ICD-10-CM | POA: Diagnosis not present

## 2021-10-06 DIAGNOSIS — F419 Anxiety disorder, unspecified: Secondary | ICD-10-CM

## 2021-10-06 DIAGNOSIS — Z01818 Encounter for other preprocedural examination: Secondary | ICD-10-CM

## 2021-10-06 DIAGNOSIS — K219 Gastro-esophageal reflux disease without esophagitis: Secondary | ICD-10-CM | POA: Diagnosis not present

## 2021-10-06 DIAGNOSIS — I4891 Unspecified atrial fibrillation: Secondary | ICD-10-CM | POA: Insufficient documentation

## 2021-10-06 DIAGNOSIS — Z87891 Personal history of nicotine dependence: Secondary | ICD-10-CM

## 2021-10-06 HISTORY — DX: Unspecified osteoarthritis, unspecified site: M19.90

## 2021-10-06 HISTORY — DX: Personal history of adenomatous and serrated colon polyps: Z86.0101

## 2021-10-06 HISTORY — DX: Mixed hyperlipidemia: E78.2

## 2021-10-06 HISTORY — PX: INGUINAL HERNIA REPAIR: SHX194

## 2021-10-06 HISTORY — DX: Generalized anxiety disorder: F41.1

## 2021-10-06 HISTORY — DX: Calculus of kidney: N20.0

## 2021-10-06 HISTORY — DX: Personal history of colonic polyps: Z86.010

## 2021-10-06 HISTORY — DX: Paroxysmal atrial fibrillation: I48.0

## 2021-10-06 LAB — POCT I-STAT, CHEM 8
BUN: 10 mg/dL (ref 8–23)
Calcium, Ion: 1.26 mmol/L (ref 1.15–1.40)
Chloride: 102 mmol/L (ref 98–111)
Creatinine, Ser: 0.8 mg/dL (ref 0.61–1.24)
Glucose, Bld: 110 mg/dL — ABNORMAL HIGH (ref 70–99)
HCT: 48 % (ref 39.0–52.0)
Hemoglobin: 16.3 g/dL (ref 13.0–17.0)
Potassium: 4 mmol/L (ref 3.5–5.1)
Sodium: 140 mmol/L (ref 135–145)
TCO2: 25 mmol/L (ref 22–32)

## 2021-10-06 SURGERY — REPAIR, HERNIA, INGUINAL, ADULT
Anesthesia: General | Site: Groin | Laterality: Left

## 2021-10-06 MED ORDER — LIDOCAINE 2% (20 MG/ML) 5 ML SYRINGE
INTRAMUSCULAR | Status: DC | PRN
  Administered 2021-10-06: 80 mg via INTRAVENOUS

## 2021-10-06 MED ORDER — PROPOFOL 10 MG/ML IV BOLUS
INTRAVENOUS | Status: DC | PRN
  Administered 2021-10-06: 60 mg via INTRAVENOUS

## 2021-10-06 MED ORDER — BUPIVACAINE HCL (PF) 0.5 % IJ SOLN
INTRAMUSCULAR | Status: AC
  Filled 2021-10-06: qty 30

## 2021-10-06 MED ORDER — AMISULPRIDE (ANTIEMETIC) 5 MG/2ML IV SOLN: 10.0000 mg | Freq: Once | INTRAVENOUS | Status: DC | PRN

## 2021-10-06 MED ORDER — SUGAMMADEX SODIUM 200 MG/2ML IV SOLN
INTRAVENOUS | Status: DC | PRN
  Administered 2021-10-06 (×2): 100 mg via INTRAVENOUS

## 2021-10-06 MED ORDER — FENTANYL CITRATE (PF) 100 MCG/2ML IJ SOLN
INTRAMUSCULAR | Status: DC | PRN
  Administered 2021-10-06 (×2): 50 ug via INTRAVENOUS

## 2021-10-06 MED ORDER — EPHEDRINE SULFATE-NACL 50-0.9 MG/10ML-% IV SOSY
PREFILLED_SYRINGE | INTRAVENOUS | Status: DC | PRN
  Administered 2021-10-06: 10 mg via INTRAVENOUS

## 2021-10-06 MED ORDER — OXYCODONE-ACETAMINOPHEN 5-325 MG PO TABS: 1.0000 | ORAL_TABLET | Freq: Four times a day (QID) | ORAL | 0 refills | Status: AC | PRN

## 2021-10-06 MED ORDER — CEFAZOLIN SODIUM-DEXTROSE 2-4 GM/100ML-% IV SOLN
2.0000 g | INTRAVENOUS | Status: AC
  Administered 2021-10-06: 2 g via INTRAVENOUS

## 2021-10-06 MED ORDER — FENTANYL CITRATE (PF) 100 MCG/2ML IJ SOLN
INTRAMUSCULAR | Status: AC
  Filled 2021-10-06: qty 2

## 2021-10-06 MED ORDER — GLYCOPYRROLATE PF 0.2 MG/ML IJ SOSY
PREFILLED_SYRINGE | INTRAMUSCULAR | Status: AC
  Filled 2021-10-06: qty 1

## 2021-10-06 MED ORDER — DEXAMETHASONE SODIUM PHOSPHATE 10 MG/ML IJ SOLN
INTRAMUSCULAR | Status: DC | PRN
  Administered 2021-10-06: 10 mg via INTRAVENOUS

## 2021-10-06 MED ORDER — CHLORHEXIDINE GLUCONATE CLOTH 2 % EX PADS: 6.0000 | MEDICATED_PAD | Freq: Once | CUTANEOUS | Status: DC

## 2021-10-06 MED ORDER — ROCURONIUM BROMIDE 10 MG/ML (PF) SYRINGE
PREFILLED_SYRINGE | INTRAVENOUS | Status: DC | PRN
  Administered 2021-10-06: 50 mg via INTRAVENOUS
  Administered 2021-10-06: 10 mg via INTRAVENOUS

## 2021-10-06 MED ORDER — PHENYLEPHRINE 80 MCG/ML (10ML) SYRINGE FOR IV PUSH (FOR BLOOD PRESSURE SUPPORT)
PREFILLED_SYRINGE | INTRAVENOUS | Status: AC
  Filled 2021-10-06: qty 10

## 2021-10-06 MED ORDER — GLYCOPYRROLATE PF 0.2 MG/ML IJ SOSY
PREFILLED_SYRINGE | INTRAMUSCULAR | Status: DC | PRN
  Administered 2021-10-06: .2 mg via INTRAVENOUS

## 2021-10-06 MED ORDER — PHENYLEPHRINE 80 MCG/ML (10ML) SYRINGE FOR IV PUSH (FOR BLOOD PRESSURE SUPPORT)
PREFILLED_SYRINGE | INTRAVENOUS | Status: DC | PRN
  Administered 2021-10-06: 160 ug via INTRAVENOUS
  Administered 2021-10-06: 50 ug via INTRAVENOUS

## 2021-10-06 MED ORDER — CEFAZOLIN SODIUM-DEXTROSE 2-4 GM/100ML-% IV SOLN
INTRAVENOUS | Status: AC
  Filled 2021-10-06: qty 100

## 2021-10-06 MED ORDER — LACTATED RINGERS IV SOLN: INTRAVENOUS | Status: DC

## 2021-10-06 MED ORDER — FENTANYL CITRATE (PF) 100 MCG/2ML IJ SOLN
25.0000 ug | INTRAMUSCULAR | Status: DC | PRN
  Administered 2021-10-06 (×2): 25 ug via INTRAVENOUS

## 2021-10-06 MED ORDER — BUPIVACAINE LIPOSOME 1.3 % IJ SUSP
INTRAMUSCULAR | Status: AC
  Filled 2021-10-06: qty 20

## 2021-10-06 MED ORDER — BUPIVACAINE LIPOSOME 1.3 % IJ SUSP
INTRAMUSCULAR | Status: DC | PRN
  Administered 2021-10-06: 20 mL

## 2021-10-06 MED ORDER — OXYCODONE HCL 5 MG/5ML PO SOLN: 5.0000 mg | Freq: Once | ORAL | Status: AC | PRN

## 2021-10-06 MED ORDER — ONDANSETRON HCL 4 MG/2ML IJ SOLN
INTRAMUSCULAR | Status: DC | PRN
  Administered 2021-10-06: 4 mg via INTRAVENOUS

## 2021-10-06 MED ORDER — BUPIVACAINE LIPOSOME 1.3 % IJ SUSP: 20.0000 mL | Freq: Once | INTRAMUSCULAR | Status: DC

## 2021-10-06 MED ORDER — OXYCODONE HCL 5 MG PO TABS
ORAL_TABLET | ORAL | Status: AC
  Filled 2021-10-06: qty 1

## 2021-10-06 MED ORDER — BUPIVACAINE HCL 0.5 % IJ SOLN
INTRAMUSCULAR | Status: DC | PRN
  Administered 2021-10-06: 20 mL

## 2021-10-06 MED ORDER — EPHEDRINE 5 MG/ML INJ
INTRAVENOUS | Status: AC
  Filled 2021-10-06: qty 5

## 2021-10-06 MED ORDER — OXYCODONE HCL 5 MG PO TABS
5.0000 mg | ORAL_TABLET | Freq: Once | ORAL | Status: AC | PRN
  Administered 2021-10-06: 5 mg via ORAL

## 2021-10-06 SURGICAL SUPPLY — 43 items
ADH SKN CLS APL DERMABOND .7 (GAUZE/BANDAGES/DRESSINGS) ×1
APL PRP STRL LF DISP 70% ISPRP (MISCELLANEOUS) ×1
BLADE CLIPPER SENSICLIP SURGIC (BLADE) ×2 IMPLANT
BLADE SURG 15 STRL LF DISP TIS (BLADE) ×2 IMPLANT
BLADE SURG 15 STRL SS (BLADE) ×1
CHLORAPREP W/TINT 26 (MISCELLANEOUS) ×2 IMPLANT
COVER BACK TABLE 60X90IN (DRAPES) ×2 IMPLANT
COVER MAYO STAND STRL (DRAPES) ×2 IMPLANT
DECANTER SPIKE VIAL GLASS SM (MISCELLANEOUS) ×2 IMPLANT
DERMABOND ADVANCED .7 DNX12 (GAUZE/BANDAGES/DRESSINGS) ×2 IMPLANT
DRAIN PENROSE 0.5X18 (DRAIN) IMPLANT
DRAPE LAPAROTOMY TRNSV 102X78 (DRAPES) ×2 IMPLANT
DRAPE UTILITY XL STRL (DRAPES) ×2 IMPLANT
ELECT REM PT RETURN 9FT ADLT (ELECTROSURGICAL) ×1
ELECTRODE REM PT RTRN 9FT ADLT (ELECTROSURGICAL) ×2 IMPLANT
GAUZE 4X4 16PLY ~~LOC~~+RFID DBL (SPONGE) ×2 IMPLANT
GLOVE SURG ORTHO 8.0 STRL STRW (GLOVE) ×2 IMPLANT
GOWN STRL REUS W/TWL XL LVL3 (GOWN DISPOSABLE) ×2 IMPLANT
KIT TURNOVER CYSTO (KITS) ×2 IMPLANT
MANIFOLD NEPTUNE II (INSTRUMENTS) IMPLANT
MESH ULTRAPRO 3X6 7.6X15CM (Mesh General) ×2 IMPLANT
NDL HYPO 25X1 1.5 SAFETY (NEEDLE) ×2 IMPLANT
NEEDLE HYPO 25X1 1.5 SAFETY (NEEDLE) ×1 IMPLANT
NS IRRIG 500ML POUR BTL (IV SOLUTION) ×2 IMPLANT
PACK BASIN DAY SURGERY FS (CUSTOM PROCEDURE TRAY) ×2 IMPLANT
PENCIL SMOKE EVACUATOR (MISCELLANEOUS) ×2 IMPLANT
SPONGE T-LAP 18X18 ~~LOC~~+RFID (SPONGE) IMPLANT
SPONGE T-LAP 4X18 ~~LOC~~+RFID (SPONGE) IMPLANT
SUT MNCRL AB 4-0 PS2 18 (SUTURE) ×2 IMPLANT
SUT NOVA 0 T19/GS 22DT (SUTURE) IMPLANT
SUT NOVA NAB DX-16 0-1 5-0 T12 (SUTURE) IMPLANT
SUT NOVA NAB GS-22 2 0 T19 (SUTURE) ×4 IMPLANT
SUT SILK 2 0 (SUTURE)
SUT SILK 2-0 18XBRD TIE 12 (SUTURE) IMPLANT
SUT VIC AB 3-0 SH 18 (SUTURE) ×2 IMPLANT
SUT VIC AB 3-0 SH 27 (SUTURE) ×1
SUT VIC AB 3-0 SH 27X BRD (SUTURE) IMPLANT
SYR BULB EAR ULCER 3OZ GRN STR (SYRINGE) ×2 IMPLANT
SYR CONTROL 10ML LL (SYRINGE) ×2 IMPLANT
TOWEL OR 17X26 10 PK STRL BLUE (TOWEL DISPOSABLE) ×2 IMPLANT
TUBE CONNECTING 12X1/4 (SUCTIONS) ×2 IMPLANT
WATER STERILE IRR 500ML POUR (IV SOLUTION) IMPLANT
YANKAUER SUCT BULB TIP NO VENT (SUCTIONS) ×2 IMPLANT

## 2021-10-06 NOTE — Op Note (Signed)
Procedure Note  Pre-operative Diagnosis:  Left inguinal hernia, reducible  Post-operative Diagnosis: same  Procedure:  Open left inguinal hernia repair with mesh  Surgeon:  Armandina Gemma, MD  Anesthesia:  General  Preparation:  Chlora-prep  Estimated Blood Loss: minimal  Complications:  none  Indications: The patient presented with a left, reducible hernia.  Phillip [name] is referred by Dr. Derinda Late for surgical evaluation and management of left inguinal hernia. Patient first noted symptoms approximately 6 months ago. He has seen a gradual enlargement in the bulge in the left groin. He did have an episode in July of abdominal pain which required evaluation in the emergency department including a CT scan of the abdomen and pelvis. This demonstrated a left inguinal hernia containing a small amount of colon. There was also some change in the small intestine worrisome for enteritis. Patient has had a previous laparoscopic cholecystectomy. He has had no prior hernia repairs. 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 He presents today to discuss hernia repair.  Procedure Details  The patient was evaluated in the holding area. All of the patient's questions were answered and the proposed procedure was confirmed. The site of the procedure was properly marked. The patient was taken to the Operating Room, identified by name, and the procedure verified as inguinal hernia repair.  The patient was placed in the supine position and underwent induction of anesthesia. A "Time Out" was performed per routine. The lower abdomen and groin were prepped and draped in the usual aseptic fashion.  After ascertaining that an adequate level of anesthesia had been obtained, an incision was made in the groin with a #10 blade.  Dissection was carried through the subcutaneous tissues and hemostasis obtained with the electrocautery.  A Gelpi retractor was placed for exposure.  The external oblique fascia was  incised in line with it's fibers and extended through the external inguinal ring.  The cord structures were dissected out of the inguinal canal and encircled with a Penrose drain.  The floor of the inguinal canal was dissected out.  There was laxity of the floor without a direct defect.  The cord was explored and there was a moderate sized sliding type indirect hernia present.  This was dissected away from the cord structures and reduced.  The internal ring was closed laterally with interrupted 0-Novofil simple sutures.  The floor of the inguinal canal was reconstructed with Ethicon Ultrapro mesh cut to the appropriate dimensions.  It was secured to the pubic tubercle with a 2-0 Novafil suture and along the inguinal ligament with a running 2-0 Novafil suture.  Mesh was split to accommodate the cord structures.  The superior margin of the mesh was secured to the transversalis and internal oblique musculature with interrupted 2-0 Novafil sutures.  The tails of the mesh were overlapped lateral to the cord structures and secured to the inguinal ligament with interrupted 2-0 Novafil sutures to recreate the internal inguinal ring.  Cord structures were returned to the inguinal canal.  Local anesthetic using a mixture of Exparel and Marcaine was infiltrated throughout the field.  External oblique fascia was closed with interrupted 3-0 Vicryl sutures.  Subcutaneous tissues were closed with interrupted 3-0 Vicryl sutures.  Skin was anesthetized with local anesthetic using a mixture of Exparel and Marcaine, and the skin edges were re-approximated with a running 4-0 Monocryl suture.  Wound was washed and dried and Dermabond was applied.  Instrument, sponge, and needle counts were correct prior to  closure and at the conclusion of the case.  The patient tolerated the procedure well.  The patient was awakened from anesthesia and brought to the recovery room in stable condition.  Armandina Gemma, [place]  [facility] Office: [phone]

## 2021-10-06 NOTE — Discharge Instructions (Addendum)
[facility]  HERNIA REPAIR POST OP INSTRUCTIONS  Always review your discharge instruction sheet given to you by the facility where your surgery was performed.  A  prescription for pain medication may be sent to your pharmacy on discharge.  Take your pain medication as prescribed.  If narcotic pain medicine is not needed, then you may take acetaminophen (Tylenol) or ibuprofen (Advil) as needed.  Take your usually prescribed medications unless otherwise directed.  If you need a refill on your pain medication, please contact your pharmacy.  They will contact our office to request authorization. Prescriptions will not be filled after 5:00 PM daily or on weekends.  You should follow a light diet the first 24 hours after arrival home, such as soup and crackers or toast.  Be sure to include plenty of fluids daily.  Resume your normal diet the day after surgery.  Most patients will experience some swelling and bruising around the surgical site.  Ice packs and reclining will help.  Swelling and bruising can take several days to resolve.   It is common to experience some constipation if taking pain medication after surgery.  Increasing fluid intake and taking a stool softener (such as Colace) will usually help or prevent this problem from occurring.  A mild laxative (Milk of Magnesia or Miralax) should be taken according to package directions if there is no bowel movement after 48 hours.  You will likely have Dermabond (topical glue) over your incisions.  This seals the incisions and allows you to bathe and shower at any time after your surgery.  Glue should remain in place for up to 10 days.  It may be removed after 10 days by pealing off the Dermabond material or using Vaseline or naval jelly to remove.  ACTIVITIES:  You may resume regular (light) daily activities beginning the next day - such as daily self-care, walking, climbing stairs - gradually increasing activities as tolerated.  You  may have sexual intercourse when it is comfortable.  Refrain from any heavy lifting or straining until approved by your doctor.  You may drive when you are no longer taking prescription pain medication, when you can comfortably wear a seatbelt, and when you can safely maneuver your car and apply the brakes.  You should see your doctor in the office for a follow-up appointment approximately 2-3 weeks after your surgery.  Make sure that you call for this appointment within a day or two after you arrive home to insure a convenient appointment time.  WHEN TO CALL YOUR DOCTOR: Fever greater than 101.0 Inability to urinate Persistent nausea and/or vomiting Extreme swelling or bruising Continued bleeding from incision Increased pain, redness, or drainage from the incision  The clinic staff is available to answer your questions during regular business hours.  Please don't hesitate to call and ask to speak to one of the nurses for clinical concerns.  If you have a medical emergency, go to the nearest emergency room or call 911.  A surgeon from [facility] is always on call for the hospital.   [facility] [address], [place], [place], [place]  [address]  [phone] ? [phone] ? FAX [phone]      Post Anesthesia Home Care Instructions  Activity: Get plenty of rest for the remainder of the day. A responsible individual must stay with you for 24 hours following the procedure.  For the next 24 hours, DO NOT: -Drive a car -Paediatric nurse -Drink alcoholic  beverages -Take any medication unless instructed by your physician -Make any legal decisions or sign important papers.  Meals: Start with liquid foods such as gelatin or soup. Progress to regular foods as tolerated. Avoid greasy, spicy, heavy foods. If nausea and/or vomiting occur, drink only clear liquids until the nausea and/or vomiting subsides. Call your physician if vomiting  continues.  Special Instructions/Symptoms: Your throat may feel dry or sore from the anesthesia or the breathing tube placed in your throat during surgery. If this causes discomfort, gargle with warm salt water. The discomfort should disappear within 24 hours.  If you had a scopolamine patch placed behind your ear for the management of post- operative nausea and/or vomiting:  1. The medication in the patch is effective for 72 hours, after which it should be removed.  Wrap patch in a tissue and discard in the trash. Wash hands thoroughly with soap and water. 2. You may remove the patch earlier than 72 hours if you experience unpleasant side effects which may include dry mouth, dizziness or visual disturbances. 3. Avoid touching the patch. Wash your hands with soap and water after contact with the patch.    Information for Discharge Teaching: EXPAREL (bupivacaine liposome injectable suspension)   Your surgeon or anesthesiologist gave you EXPAREL(bupivacaine) to help control your pain after surgery.  EXPAREL is a local anesthetic that provides pain relief by numbing the tissue around the surgical site. EXPAREL is designed to release pain medication over time and can control pain for up to 72 hours. Depending on how you respond to EXPAREL, you may require less pain medication during your recovery.  Possible side effects: Temporary loss of sensation or ability to move in the area where bupivacaine was injected. Nausea, vomiting, constipation Rarely, numbness and tingling in your mouth or lips, lightheadedness, or anxiety may occur. Call your doctor right away if you think you may be experiencing any of these sensations, or if you have other questions regarding possible side effects.  Follow all other discharge instructions given to you by your surgeon or nurse. Eat a healthy diet and drink plenty of water or other fluids.  If you return to the hospital for any reason within 96 hours following  the administration of EXPAREL, it is important for health care providers to know that you have received this anesthetic. A teal colored band has been placed on your arm with the date, time and amount of EXPAREL you have received in order to alert and inform your health care providers. Please leave this armband in place for the full 96 hours following administration, and then you may remove the band.

## 2021-10-06 NOTE — Interval H&P Note (Signed)
History and Physical Interval Note:  10/06/2021 1:22 PM  Phillip [name]  has presented today for surgery, with the diagnosis of LEFT INGUINAL HERNIA.  The various methods of treatment have been discussed with the patient and family. After consideration of risks, benefits and other options for treatment, the patient has consented to    Procedure(s): OPEN REPAIR LEFT INGUINAL HERNIA WITH MESH (Left) as a surgical intervention.    The patient's history has been reviewed, patient examined, no change in status, stable for surgery.  I have reviewed the patient's chart and labs.  Questions were answered to the patient's satisfaction.    Armandina Gemma, [facility] [place] practice Office: [place]

## 2021-10-06 NOTE — Transfer of Care (Signed)
Immediate Anesthesia Transfer of Care Note  Patient: Phillip [name]  Procedure(s) Performed: OPEN REPAIR LEFT INGUINAL HERNIA WITH MESH (Left: Groin)  Patient Location: PACU  Anesthesia Type:General  Level of Consciousness: awake, alert , oriented and patient cooperative  Airway & Oxygen Therapy: Patient Spontanous Breathing  Post-op Assessment: Report given to RN and Post -op Vital signs reviewed and stable  Post vital signs: Reviewed and stable  Last Vitals:  Vitals Value Taken Time  BP 133/89 10/06/21 1510  Temp    Pulse 66 10/06/21 1512  Resp 17 10/06/21 1512  SpO2 94 % 10/06/21 1512  Vitals shown include unvalidated device data.  Last Pain:  Vitals:   10/06/21 1225  TempSrc: Oral  PainSc: 0-No pain      Patients Stated Pain Goal: 5 (40/10/27 2536)  Complications: No notable events documented.

## 2021-10-06 NOTE — Anesthesia Procedure Notes (Signed)
Procedure Name: Intubation Date/Time: 10/06/2021 1:45 PM  Performed by: Rogers Blocker, CRNAPre-anesthesia Checklist: Patient identified, Emergency Drugs available, Suction available and Patient being monitored Patient Re-evaluated:Patient Re-evaluated prior to induction Oxygen Delivery Method: Circle System Utilized Preoxygenation: Pre-oxygenation with 100% oxygen Induction Type: IV induction Ventilation: Mask ventilation without difficulty Laryngoscope Size: Mac and 4 Grade View: Grade I Tube type: Oral Tube size: 7.0 mm Number of attempts: 1 Airway Equipment and Method: Stylet and Bite block Placement Confirmation: ETT inserted through vocal cords under direct vision, positive ETCO2 and breath sounds checked- equal and bilateral Secured at: 22 cm Tube secured with: Tape Dental Injury: Teeth and Oropharynx as per pre-operative assessment

## 2021-10-06 NOTE — Anesthesia Preprocedure Evaluation (Addendum)
Anesthesia Evaluation  Patient identified by MRN, date of birth, ID band Patient awake    Reviewed: Allergy & Precautions, NPO status , Patient's Chart, lab work & pertinent test results  Airway Mallampati: II  TM Distance: >3 FB Neck ROM: Full    Dental no notable dental hx. (+) Dental Advisory Given, Teeth Intact   Pulmonary former smoker,    Pulmonary exam normal breath sounds clear to auscultation       Cardiovascular hypertension, Pt. on home beta blockers and Pt. on medications Normal cardiovascular exam Rhythm:Regular Rate:Normal  ECHO 01/2020 1. Left ventricular ejection fraction, by estimation, is 55 to 60%. The left ventricle has normal function. The left ventricle has no regional wall motion abnormalities. The left ventricular internal cavity size was mildly dilated. Left ventricular diastolic parameters are consistent with Grade I diastolic dysfunction (impaired relaxation). Elevated left ventricular end-diastolic pressure. The average left ventricular global longitudinal strain is -21.1 %. The global longitudinal strain is normal.  2. Right ventricular systolic function is normal. The right ventricular size is normal.  3. Left atrial size was mildly dilated.  4. The mitral valve is normal in structure. Trivial mitral valve regurgitation. No evidence of mitral stenosis. There is mild prolapse of anterior of the mitral valve.  5. The aortic valve is tricuspid. Aortic valve regurgitation is trivial. No aortic stenosis is present.  6. Aortic dilatation noted. There is mild dilatation at the level of the sinuses of Valsalva, measuring 37 mm. There is mild dilatation of the ascending aorta, measuring 39 mm.  7. The inferior vena cava is normal in size with greater than 50% respiratory variability, suggesting right atrial pressure of 3 mmHg.   Comparison(s): 06/04/17 EF 60-65%.    Neuro/Psych PSYCHIATRIC DISORDERS Anxiety  negative neurological ROS     GI/Hepatic Neg liver ROS, GERD  Controlled,  Endo/Other  negative endocrine ROS  Renal/GU Renal disease     Musculoskeletal  (+) Arthritis ,   Abdominal   Peds  Hematology negative hematology ROS (+)   Anesthesia Other Findings   Reproductive/Obstetrics                            Anesthesia Physical Anesthesia Plan  ASA: 3  Anesthesia Plan: General   Post-op Pain Management: Ofirmev IV (intra-op)* and Precedex   Induction: Intravenous  PONV Risk Score and Plan: 4 or greater and Ondansetron, Dexamethasone, Treatment may vary due to age or medical condition and Midazolam  Airway Management Planned: Oral ETT  Additional Equipment: None  Intra-op Plan:   Post-operative Plan: Extubation in OR  Informed Consent: I have reviewed the patients History and Physical, chart, labs and discussed the procedure including the risks, benefits and alternatives for the proposed anesthesia with the patient or authorized representative who has indicated his/her understanding and acceptance.     Dental advisory given  Plan Discussed with: CRNA  Anesthesia Plan Comments:        Anesthesia Quick Evaluation

## 2021-10-06 NOTE — Anesthesia Postprocedure Evaluation (Signed)
Anesthesia Post Note  Patient: Phillip [name]  Procedure(s) Performed: OPEN REPAIR LEFT INGUINAL HERNIA WITH MESH (Left: Groin)     Patient location during evaluation: PACU Anesthesia Type: General Level of consciousness: awake and alert Pain management: pain level controlled Vital Signs Assessment: post-procedure vital signs reviewed and stable Respiratory status: spontaneous breathing, nonlabored ventilation and respiratory function stable Cardiovascular status: blood pressure returned to baseline and stable Postop Assessment: no apparent nausea or vomiting Anesthetic complications: no   No notable events documented.  Last Vitals:  Vitals:   10/06/21 1554 10/06/21 1640  BP: 132/83 102/69  Pulse: (!) 57 (!) 56  Resp: 13 14  Temp: (!) 36.3 C 36.5 C  SpO2: 97% 98%    Last Pain:  Vitals:   10/06/21 1642  TempSrc:   PainSc: 5                  Lidia Collum

## 2021-10-07 ENCOUNTER — Encounter (HOSPITAL_BASED_OUTPATIENT_CLINIC_OR_DEPARTMENT_OTHER): Payer: Self-pay | Admitting: Surgery

## 2021-10-14 ENCOUNTER — Ambulatory Visit: Payer: 59 | Admitting: Internal Medicine

## 2022-11-01 IMAGING — DX DG CHEST 2V
2 series · 2 of 2 positions shown · non-contrast
Comparison: 08/06/2009

CLINICAL DATA: Chest pain

EXAM:
CHEST - 2 VIEW

[chest pa]
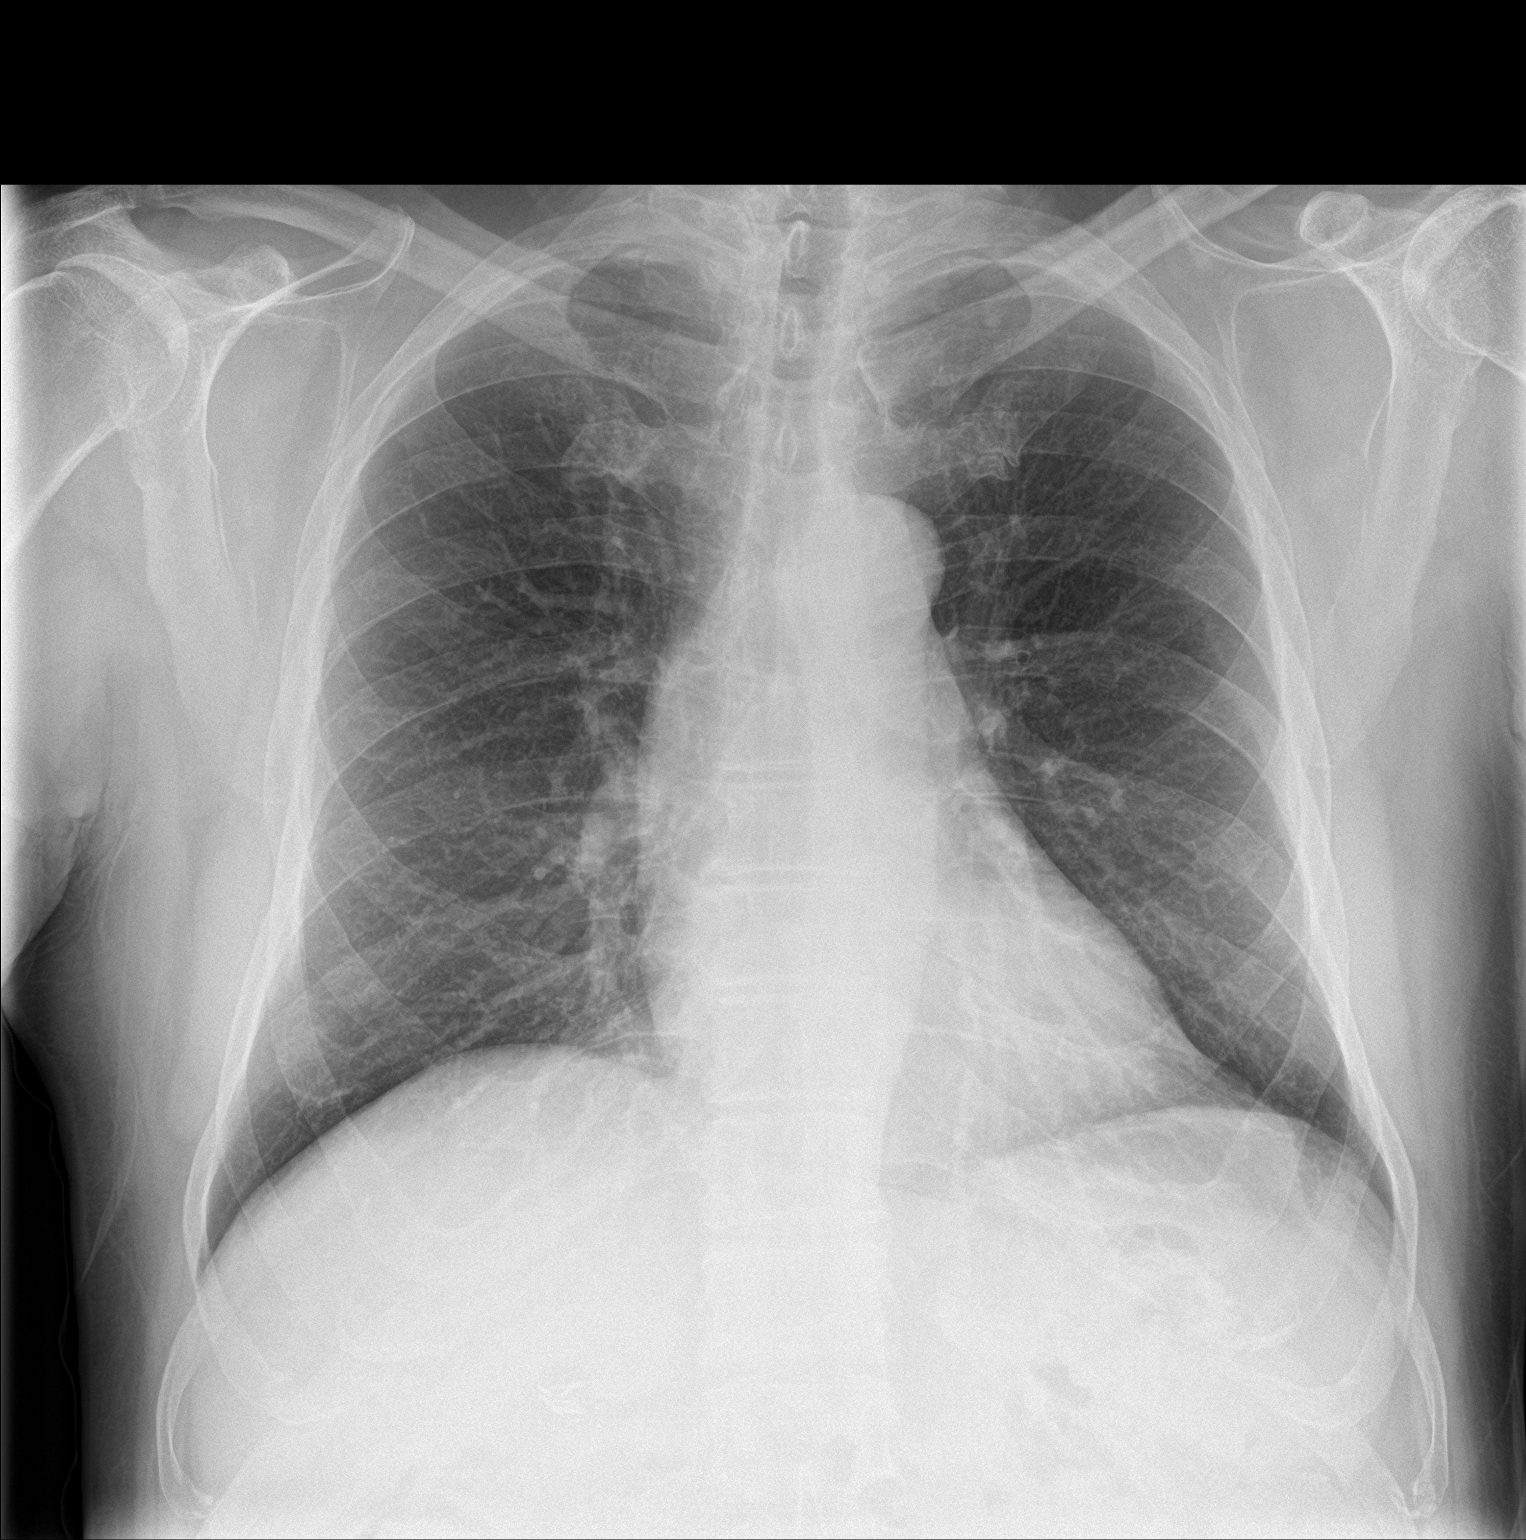

[chest lat]
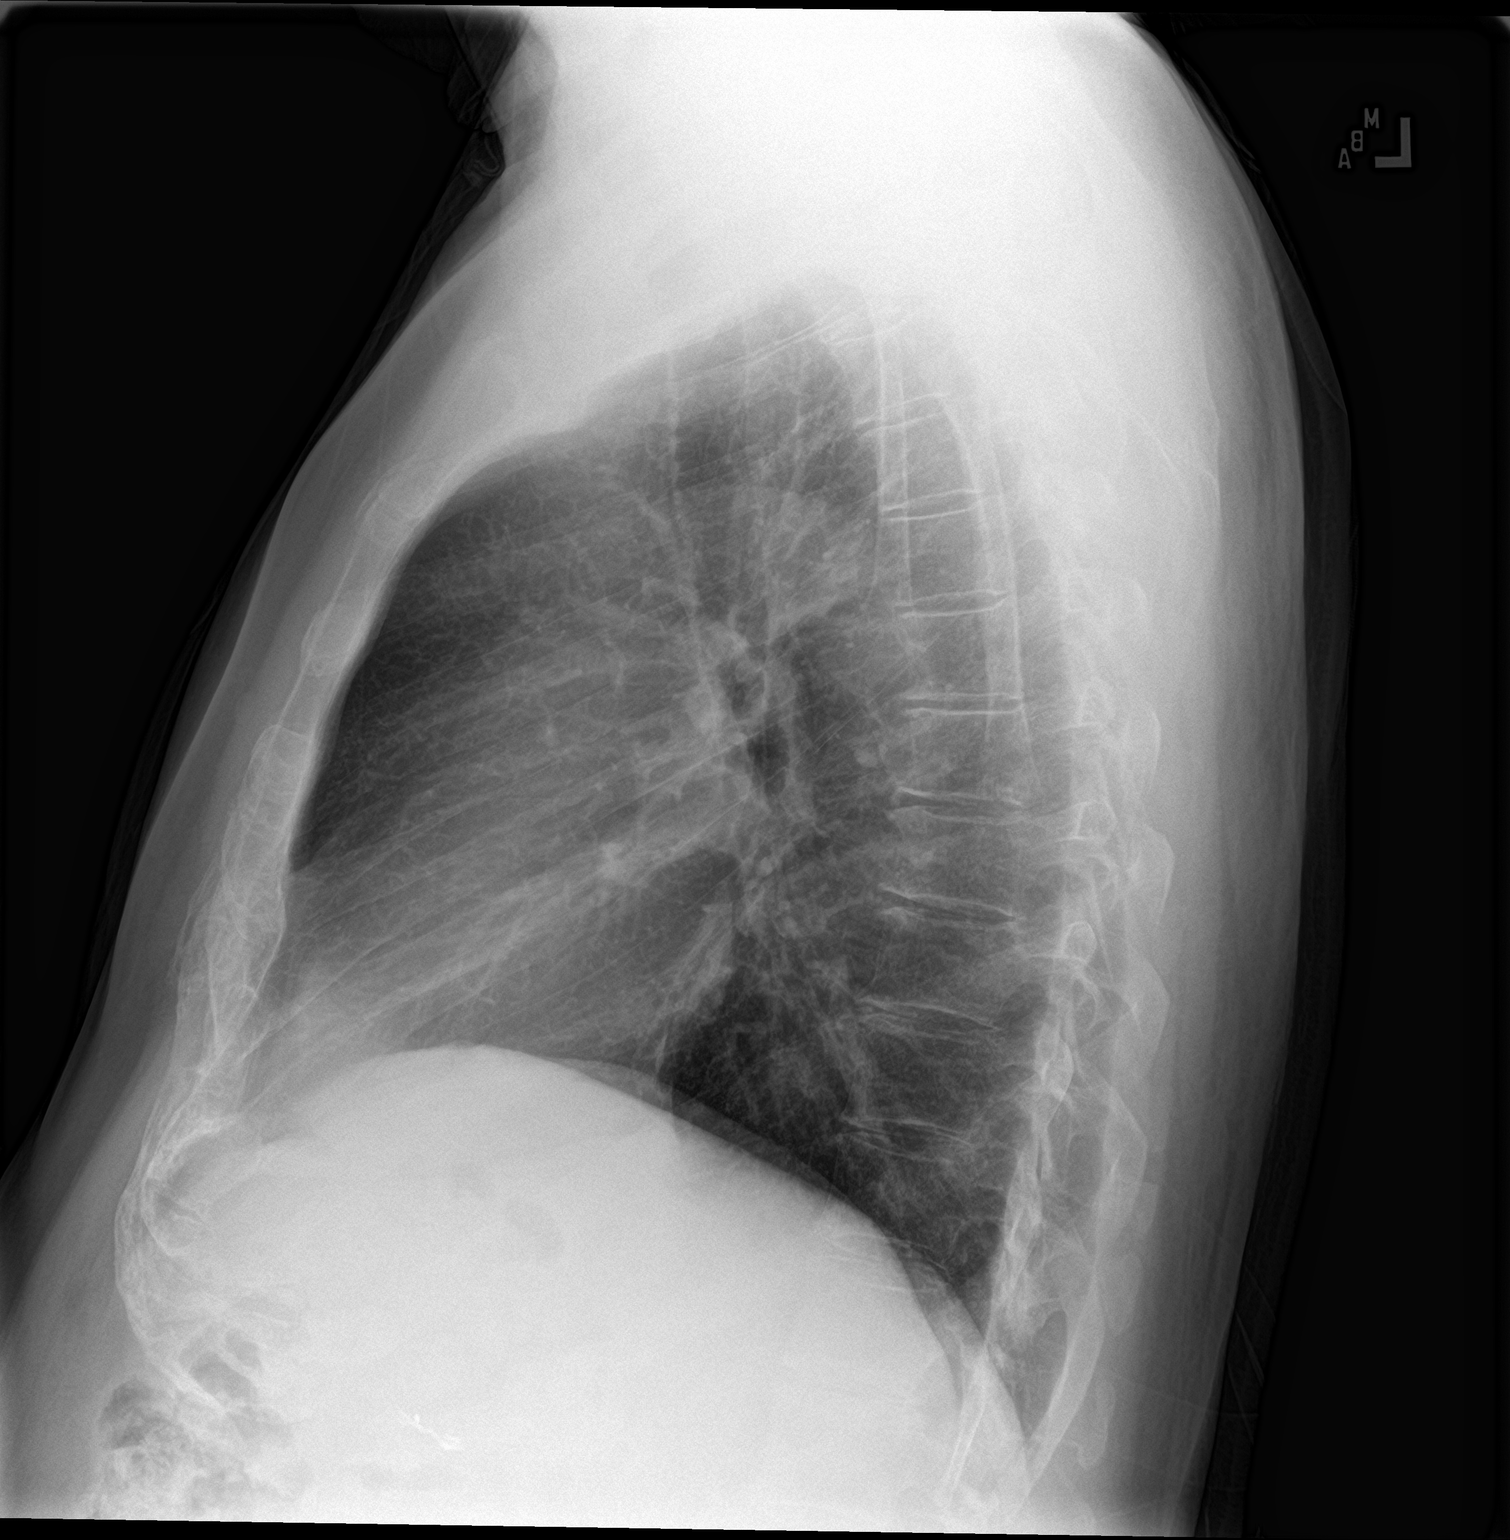

[2 of 2 positions shown; findings below may reference images not displayed]

FINDINGS: The heart size and mediastinal contours are within normal limits.
Both lungs are clear. The visualized skeletal structures are
unremarkable.
IMPRESSION: No active cardiopulmonary disease.

## 2022-12-06 IMAGING — CT CT ABD-PELV W/ CM
2 of 5 series · 16 of 46 positions shown, 18 images · IV contrast (omnipaque)
Comparison: CT 09/26/2015

CLINICAL DATA: Abdomen distension

EXAM:
CT ABDOMEN AND PELVIS WITH CONTRAST
TECHNIQUE: Multidetector CT imaging of the abdomen and pelvis was performed
using the standard protocol following bolus administration of
intravenous contrast.
CONTRAST:  100mL OMNIPAQUE IOHEXOL 300 MG/ML  SOLN

[Series 2: axial st · axial · 0.83mm/px · z∈[-487,-37]mm · 13 of 102 slices shown, 15 images]
[im 6/102  soft-tissue]
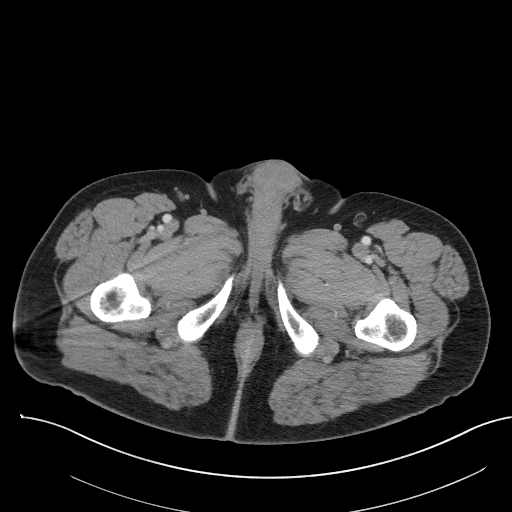
[im 6/102  bone]
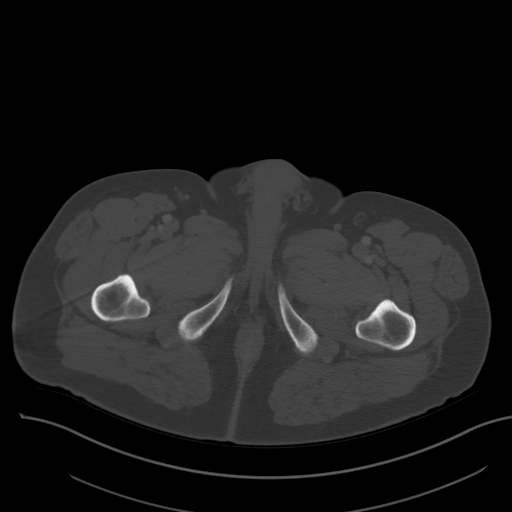
[im 12/102  soft-tissue]
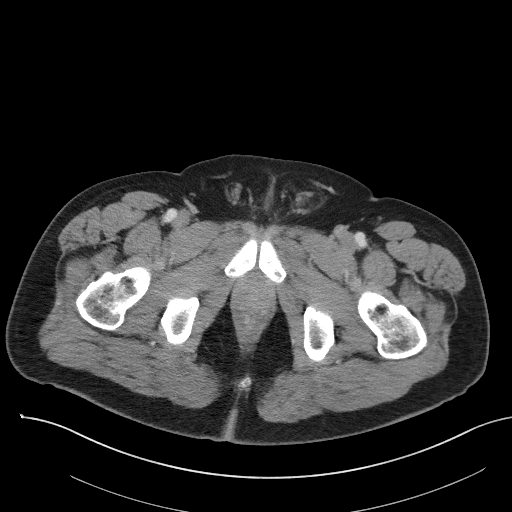
[im 23/102  soft-tissue]
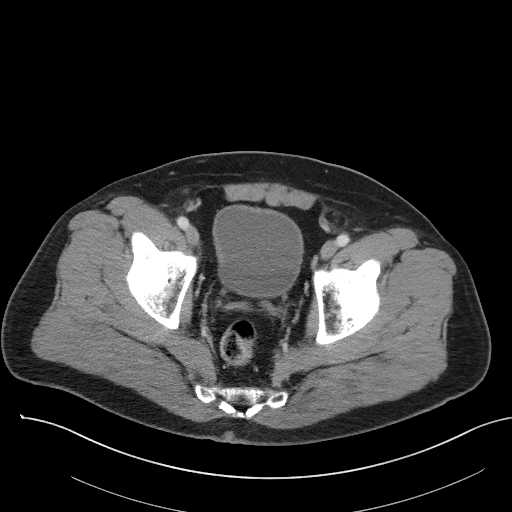
[im 29/102  soft-tissue]
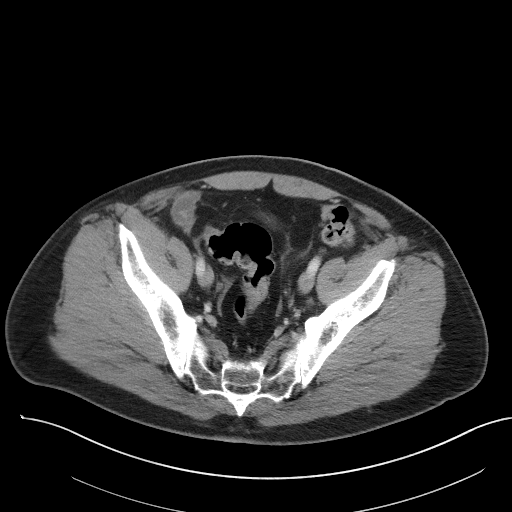
[im 34/102  soft-tissue]
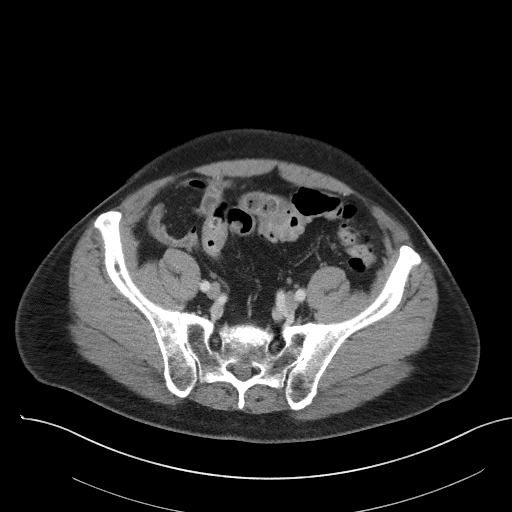
[im 45/102  soft-tissue]
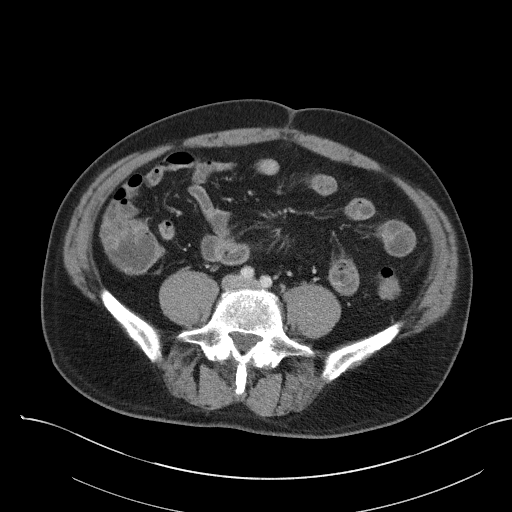
[im 51/102  soft-tissue]
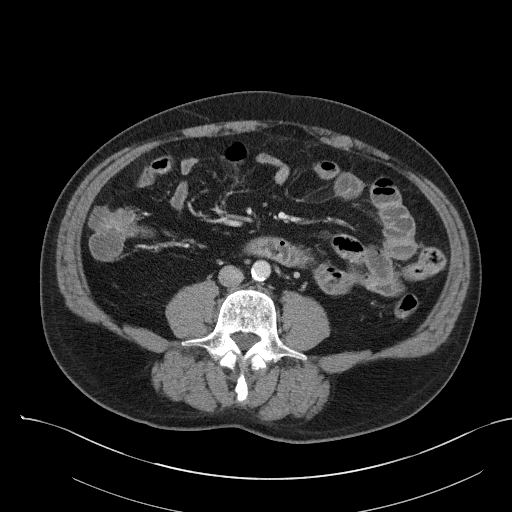
[im 57/102  soft-tissue]
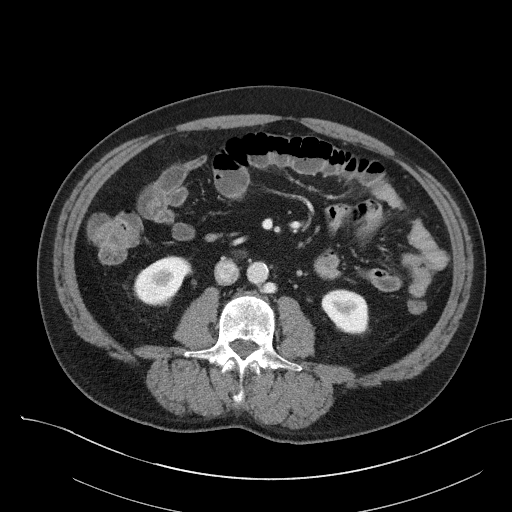
[im 68/102  soft-tissue]
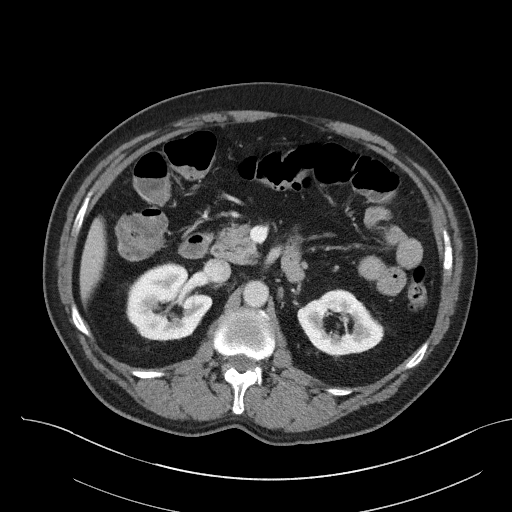
[im 68/102  bone]
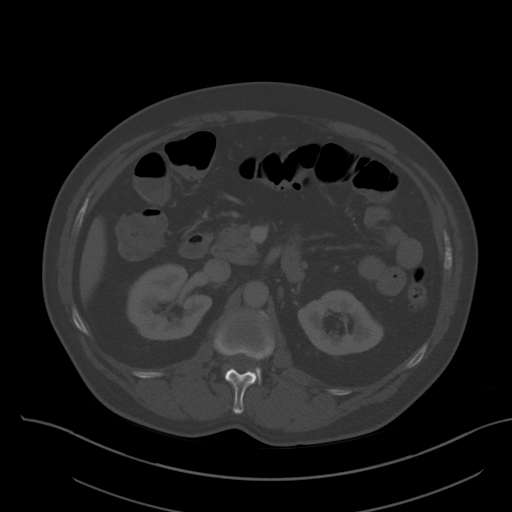
[im 73/102  soft-tissue]
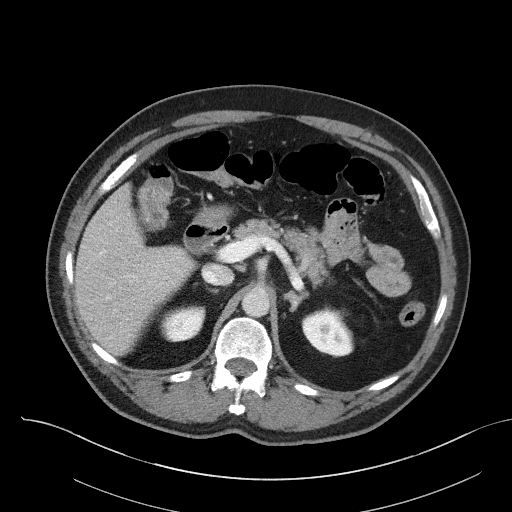
[im 79/102  soft-tissue]
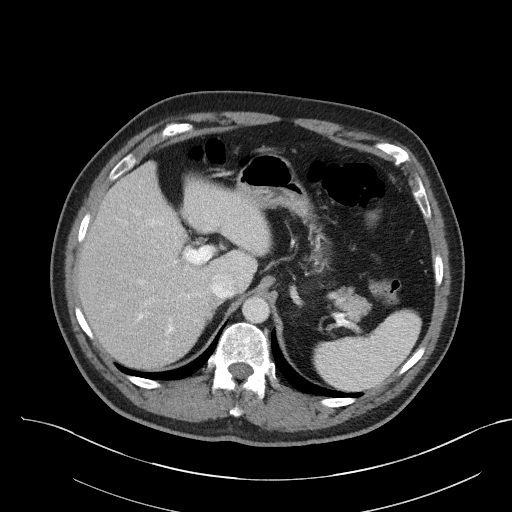
[im 90/102  soft-tissue]
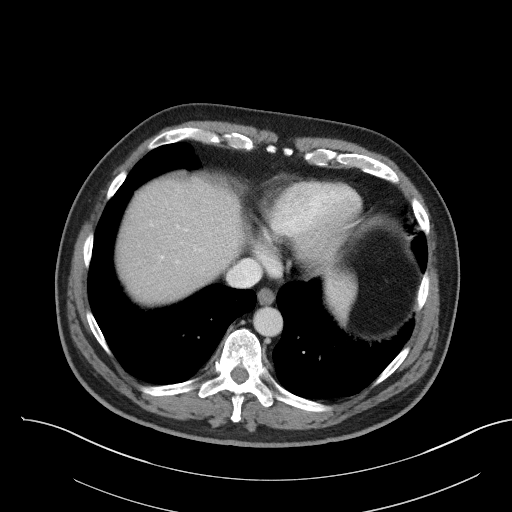
[im 96/102  soft-tissue]
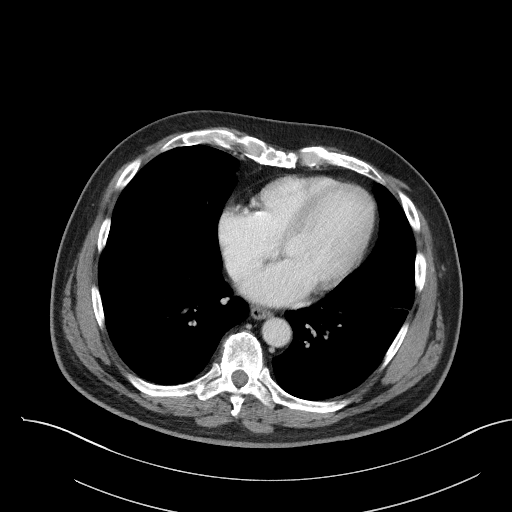

[Series 6: coronal st · coronal · 0.81mm/px · 3 of 106 slices shown]
[im 36/106  soft-tissue]
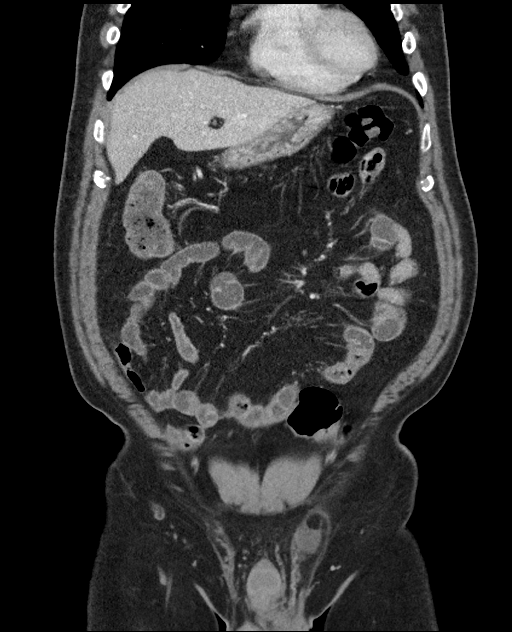
[im 47/106  soft-tissue]
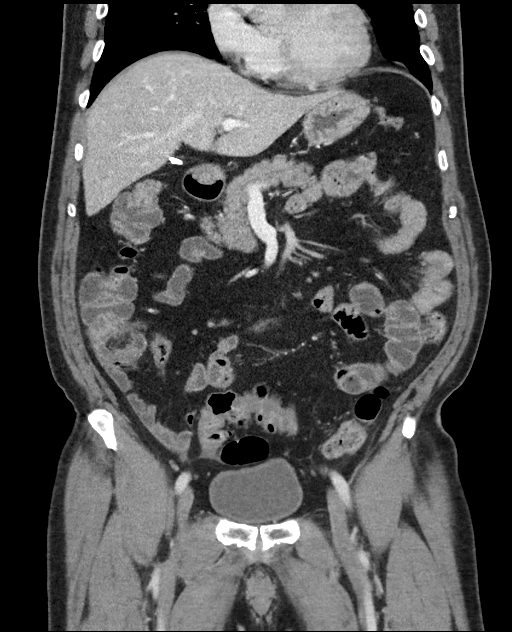
[im 59/106  soft-tissue]
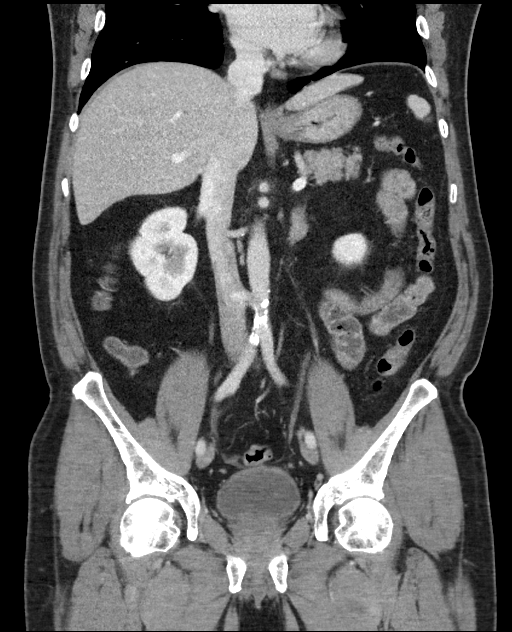

[16 of 46 positions shown; findings below may reference images not displayed]

FINDINGS: Lower chest: Lung bases demonstrate no acute consolidation or
effusion.

Hepatobiliary: No focal liver abnormality is seen. Status post
cholecystectomy. No biliary dilatation.

Pancreas: Unremarkable. No pancreatic ductal dilatation or
surrounding inflammatory changes.

Spleen: Normal in size without focal abnormality.

Adrenals/Urinary Tract: Adrenal glands are normal. Kidneys show no
hydronephrosis. 7 mm stone in the upper pole left kidney. Bladder is
unremarkable

Stomach/Bowel: Stomach nonenlarged. Mildly dilated fluid-filled
loops of mid small bowel measuring up to 3 cm with gradual
transition to fluid-filled nondistended small bowel. Negative
appendix. No acute bowel wall thickening.

Vascular/Lymphatic: Moderate aortic atherosclerosis without
aneurysm. Retroaortic left renal vein. No suspicious nodes

Reproductive: Prostate is unremarkable.

Other: No free air.  Trace free fluid in the pelvis

Musculoskeletal: No acute or significant osseous findings.
IMPRESSION: 1. Mildly dilated fluid-filled loops of mid small bowel with gradual
transition to fluid-filled nondistended small bowel. Ileus or
enteritis is favored over low-grade/partial bowel obstruction.
Negative for bowel wall thickening.
2. Trace free fluid in the pelvis.
3. 7 mm nonobstructing stone in the upper pole of the left kidney.

Aortic Atherosclerosis ([id number]).

## 2023-12-02 ENCOUNTER — Ambulatory Visit: Payer: Self-pay | Admitting: Surgery

## 2023-12-09 NOTE — Progress Notes (Signed)
 Date of COVID positive in last 90 days:  PCP - Maude Sprague, MD Cardiologist - Franky Mace, MD LOV 05/07/22 Non-interventional Cards- Elsie Milliner, MD  Chest x-ray - N/A EKG - 12/10/23 Epic/chart Stress Test - 06/12/13 Epic ECHO - 11/25/21 [facility] Cardiac Cath - 2000 Pacemaker/ICD device last checked:N/A Spinal Cord Stimulator:N/A  Bowel Prep - N/A  Sleep Study - N/A CPAP -   Fasting Blood Sugar - borderline pre Checks Blood Sugar _____ times a day  Last dose of GLP1 agonist-  N/A GLP1 instructions:  Do not take after     Last dose of SGLT-2 inhibitors-  N/A SGLT-2 instructions:  Do not take after     Blood Thinner Instructions: Eliquis, hold 3 days Aspirin  Instructions:N/A Last Dose: 12/09/23 1000  Activity level: Can go up a flight of stairs and perform activities of daily living without stopping and without symptoms of chest pain or shortness of breath.   Anesthesia review: HTN, a fib, hardening of aorta, MVP,   Patient denies shortness of breath, fever, cough and chest pain at PAT appointment  Patient verbalized understanding of instructions that were given to them at the PAT appointment. Patient was also instructed that they will need to review over the PAT instructions again at home before surgery.

## 2023-12-09 NOTE — Patient Instructions (Addendum)
 SURGICAL WAITING ROOM VISITATION  Patients having surgery or a procedure may have no more than 2 support people in the waiting area - these visitors may rotate.    Children under the age of 82 must have an adult with them who is not the patient.  Visitors with respiratory illnesses are discouraged from visiting and should remain at home.  If the patient needs to stay at the hospital during part of their recovery, the visitor guidelines for inpatient rooms apply. Pre-op nurse will coordinate an appropriate time for 1 support person to accompany patient in pre-op.  This support person may not rotate.    Please refer to the [facility] website for the visitor guidelines for Inpatients (after your surgery is over and you are in a regular room).    Your procedure is scheduled on: 12/13/23   Report to [facility] Main Entrance    Report to admitting at 12:55 PM   Call this number if you have problems the morning of surgery [phone]   Do not eat food :After Midnight.   After Midnight you may have the following liquids until 12:10 PM DAY OF SURGERY  Water Non-Citrus Juices (without pulp, NO RED-Apple, White grape, White cranberry) Black Coffee (NO MILK/CREAM OR CREAMERS, sugar ok)  Clear Tea (NO MILK/CREAM OR CREAMERS, sugar ok) regular and decaf                             Plain Jell-O (NO RED)                                           Fruit ices (not with fruit pulp, NO RED)                                     Popsicles (NO RED)                                                               Sports drinks like Gatorade (NO RED)          If you have questions, please contact your surgeon's office.   FOLLOW BOWEL PREP AND ANY ADDITIONAL PRE OP INSTRUCTIONS YOU RECEIVED FROM YOUR SURGEON'S OFFICE!!!     Oral Hygiene is also important to reduce your risk of infection.                                    Remember - BRUSH YOUR TEETH THE MORNING OF SURGERY WITH YOUR REGULAR  TOOTHPASTE  DENTURES WILL BE REMOVED PRIOR TO SURGERY PLEASE DO NOT APPLY Poly grip OR ADHESIVES!!!   Stop all vitamins and herbal supplements 7 days before surgery.   Do not take Eliquis after 12/09/23   Take these medicines the morning of surgery with A SIP OF WATER: Metoprolol              You may not have any metal on your body including jewelry, and body piercing  Do not wear lotions, powders, cologne, or deodorant              Men may shave face and neck.   Do not bring valuables to the hospital. [facility] IS NOT             RESPONSIBLE   FOR VALUABLES.   Contacts, glasses, dentures or bridgework may not be worn into surgery.  DO NOT BRING YOUR HOME MEDICATIONS TO THE HOSPITAL. PHARMACY WILL DISPENSE MEDICATIONS LISTED ON YOUR MEDICATION LIST TO YOU DURING YOUR ADMISSION IN THE HOSPITAL!    Patients discharged on the day of surgery will not be allowed to drive home.  Someone NEEDS to stay with you for the first 24 hours after anesthesia.   Special Instructions: Bring a copy of your healthcare power of attorney and living will documents the day of surgery if you haven't scanned them before.              Please read over the following fact sheets you were given: IF YOU HAVE QUESTIONS ABOUT YOUR PRE-OP INSTRUCTIONS PLEASE CALL [phone] [address] [place].   If you received a COVID test during your pre-op visit  it is requested that you wear a mask when out in public, stay away from anyone that may not be feeling well and notify your surgeon if you develop symptoms. If you test positive for Covid or have been in contact with anyone that has tested positive in the last 10 days please notify you surgeon.    [facility] - Preparing for Surgery Before surgery, you can play an important role.  Because skin is not sterile, your skin needs to be as free of germs as possible.  You can reduce the number of germs on your skin by washing with CHG (chlorahexidine gluconate) soap before  surgery.  CHG is an antiseptic cleaner which kills germs and bonds with the skin to continue killing germs even after washing. Please DO NOT use if you have an allergy to CHG or antibacterial soaps.  If your skin becomes reddened/irritated stop using the CHG and inform your nurse when you arrive at Short Stay. Do not shave (including legs and underarms) for at least 48 hours prior to the first CHG shower.  You may shave your face/neck.  Please follow these instructions carefully:  1.  Shower with CHG Soap the night before surgery ONLY (DO NOT USE THE SOAP THE MORNING OF SURGERY).  2.  If you choose to wash your hair, wash your hair first as usual with your normal  shampoo.  3.  After you shampoo, rinse your hair and body thoroughly to remove the shampoo.                             4.  Use CHG as you would any other liquid soap.  You can apply chg directly to the skin and wash.  Gently with a scrungie or clean washcloth.  5.  Apply the CHG Soap to your body ONLY FROM THE NECK DOWN.   Do   not use on face/ open                           Wound or open sores. Avoid contact with eyes, ears mouth and   genitals (private parts).  Wash face,  Genitals (private parts) with your normal soap.             6.  Wash thoroughly, paying special attention to the area where your    surgery  will be performed.  7.  Thoroughly rinse your body with warm water from the neck down.  8.  DO NOT shower/wash with your normal soap after using and rinsing off the CHG Soap.                9.  Pat yourself dry with a clean towel.            10.  Wear clean pajamas.            11.  Place clean sheets on your bed the night of your first shower and do not  sleep with pets. Day of Surgery : Do not apply any CHG, lotions/deodorants the morning of surgery.  Please wear clean clothes to the hospital/surgery center.  FAILURE TO FOLLOW THESE INSTRUCTIONS MAY RESULT IN THE CANCELLATION OF YOUR SURGERY  PATIENT  SIGNATURE_________________________________  NURSE SIGNATURE__________________________________  ________________________________________________________________________

## 2023-12-10 ENCOUNTER — Encounter (HOSPITAL_COMMUNITY): Payer: Self-pay

## 2023-12-10 ENCOUNTER — Other Ambulatory Visit: Payer: Self-pay

## 2023-12-10 ENCOUNTER — Encounter (HOSPITAL_COMMUNITY)
Admission: RE | Admit: 2023-12-10 | Discharge: 2023-12-10 | Disposition: A | Discharge status: Home / Self Care | Source: Ambulatory Visit | Attending: Surgery | Admitting: Surgery

## 2023-12-10 ENCOUNTER — Encounter (HOSPITAL_COMMUNITY): Payer: Self-pay | Admitting: Surgery

## 2023-12-10 VITALS — BP 124/87 | HR 58 | Temp 97.9°F | Resp 14 | Ht 68.0 in | Wt 189.0 lb

## 2023-12-10 DIAGNOSIS — I480 Paroxysmal atrial fibrillation: Secondary | ICD-10-CM | POA: Insufficient documentation

## 2023-12-10 DIAGNOSIS — Z01818 Encounter for other preprocedural examination: Secondary | ICD-10-CM | POA: Diagnosis present

## 2023-12-10 DIAGNOSIS — K4090 Unilateral inguinal hernia, without obstruction or gangrene, not specified as recurrent: Secondary | ICD-10-CM | POA: Insufficient documentation

## 2023-12-10 DIAGNOSIS — Z87891 Personal history of nicotine dependence: Secondary | ICD-10-CM | POA: Insufficient documentation

## 2023-12-10 DIAGNOSIS — I10 Essential (primary) hypertension: Secondary | ICD-10-CM | POA: Diagnosis not present

## 2023-12-10 LAB — BASIC METABOLIC PANEL WITH GFR
Anion gap: 8 (ref 5–15)
BUN: 19 mg/dL (ref 8–23)
CO2: 28 mmol/L (ref 22–32)
Calcium: 9.5 mg/dL (ref 8.9–10.3)
Chloride: 103 mmol/L (ref 98–111)
Creatinine, Ser: 0.83 mg/dL (ref 0.61–1.24)
GFR, Estimated: >60 mL/min (ref >60)
Glucose, Bld: 112 mg/dL — ABNORMAL HIGH (ref 70–99)
Potassium: 4.1 mmol/L (ref 3.5–5.1)
Sodium: 139 mmol/L (ref 135–145)

## 2023-12-10 LAB — CBC
HCT: 47.9 % (ref 39.0–52.0)
Hemoglobin: 16.4 g/dL (ref 13.0–17.0)
MCH: 30.4 pg (ref 26.0–34.0)
MCHC: 34.2 g/dL (ref 30.0–36.0)
MCV: 88.7 fL (ref 80.0–100.0)
Platelets: 201 K/uL (ref 150–400)
RBC: 5.4 MIL/uL (ref 4.22–5.81)
RDW: 13.1 % (ref 11.5–15.5)
WBC: 6.1 K/uL (ref 4.0–10.5)
nRBC: 0 % (ref 0.0–0.2)

## 2023-12-10 NOTE — Progress Notes (Addendum)
 Anesthesia Chart Review   Case: [id number] Date/Time: 12/13/23 1455   Procedure: REPAIR, HERNIA, INGUINAL, ADULT (Right) - OPEN REPAIR RIGHT INGUINAL HERNIA WITH MESH   Anesthesia type: General   Diagnosis: Right inguinal hernia [K40.90]   Pre-op diagnosis: RIGHT INGUINAL HERNIA   Location: [facility] ROOM 03 / [facility]   Surgeons: Eletha Boas, MD       DISCUSSION:73 y.o. former smoker with h/o HTN, PAF, MVP, right inguinal hernia scheduled for above procedure 12/13/2023 with Dr. Boas Eletha.   Pt last seen by cardiology 10/27/2023. Per OV note, asymptomatic from cardio standpoint. Pt with mild coronary calcium score without obstructive disease. Pt reports he can climb a flight of stairs without chest pain or shortness of breath.  VS: BP 124/87   Pulse (!) 58   Temp 36.6 C (Oral)   Resp 14   Ht 5' 8 (1.727 m)   Wt 85.7 kg   SpO2 100%   BMI 28.74 kg/m   PROVIDERS: Windy Coy, MD is PCP   Elsie FORBES Milliner, MD is Cardiologist with [facility]: Labs reviewed: Acceptable for surgery. (all labs ordered are listed, but only abnormal results are displayed)  Labs Reviewed  BASIC METABOLIC PANEL WITH GFR - Abnormal; Notable for the following components:      Result Value   Glucose, Bld 112 (*)    All other components within normal limits  CBC     IMAGES:   EKG:   CV: Echo 11/25/2021 (Care Everywhere) INTERPRETATION ---------------------------------------------------------------    NORMAL LEFT VENTRICULAR SYSTOLIC FUNCTION    NORMAL LA PRESSURES WITH NORMAL DIASTOLIC FUNCTION    NORMAL RIGHT VENTRICULAR SYSTOLIC FUNCTION    VALVULAR REGURGITATION: TRIVIAL MR, TRIVIAL PR, TRIVIAL TR    NO VALVULAR STENOSIS    NO PRIOR STUDY FOR COMPARISON   Echo 02/02/2020  1. Left ventricular ejection fraction, by estimation, is 55 to 60%. The  left ventricle has normal function. The left ventricle has no regional  wall motion abnormalities. The left ventricular internal cavity size was   mildly dilated. Left ventricular  diastolic parameters are consistent with Grade I diastolic dysfunction  (impaired relaxation). Elevated left ventricular end-diastolic pressure.  The average left ventricular global longitudinal strain is -21.1 %. The  global longitudinal strain is normal.   2. Right ventricular systolic function is normal. The right ventricular  size is normal.   3. Left atrial size was mildly dilated.   4. The mitral valve is normal in structure. Trivial mitral valve  regurgitation. No evidence of mitral stenosis. There is mild prolapse of  anterior of the mitral valve.   5. The aortic valve is tricuspid. Aortic valve regurgitation is trivial.  No aortic stenosis is present.   6. Aortic dilatation noted. There is mild dilatation at the level of the  sinuses of Valsalva, measuring 37 mm. There is mild dilatation of the  ascending aorta, measuring 39 mm.   7. The inferior vena cava is normal in size with greater than 50%  respiratory variability, suggesting right atrial pressure of 3 mmHg.  Past Medical History:  Diagnosis Date   Aortic atherosclerosis    GAD (generalized anxiety disorder)    GERD (gastroesophageal reflux disease)    History of adenomatous polyp of colon    Hyperlipidemia, mixed    Hypertension    Internal hemorrhoids    Lumbar herniated disc    MVP (mitral valve prolapse)    followed by cardiology---   last echo 02-02-2020  mild  MVP with trivial regurg/  no stenosis,  ef 55-60%, mild LAE   Nephrolithiasis    per CT 07-26-2021 in epic,  nonobstructive calculus left side   OA (osteoarthritis)    PAF (paroxysmal atrial fibrillation) (HCC)     Past Surgical History:  Procedure Laterality Date   CARDIAC CATHETERIZATION  2000   per pt done at [facility],  told normal coronaries   CHOLECYSTECTOMY, LAPAROSCOPIC  12/14/2000   @[facility]  by dr caswell   COLONOSCOPY WITH PROPOFOL   06/20/2021   by dr pyrtle- several   INGUINAL HERNIA REPAIR Left 10/06/2021    Procedure: OPEN REPAIR LEFT INGUINAL HERNIA WITH MESH;  Surgeon: Eletha Boas, MD;  Location: [facility];  Service: General;  Laterality: Left;   KNEE ARTHROSCOPY Left 1986   KNEE ARTHROSCOPY W/ ACL RECONSTRUCTION Left 01/14/2006   @[facility]   by dr beverley   KNEE ARTHROSCOPY W/ MENISCECTOMY Left 09/26/2008   @[facility]   by dr melodi   KNEE SURGERY Left 01/2006   open left knee surgery post ACL reconstruction for infection and re-do ACL    MEDICATIONS:  ALPRAZolam (XANAX) 0.25 MG tablet   apixaban (ELIQUIS) 5 MG TABS tablet   b complex vitamins capsule   celecoxib (CELEBREX) 200 MG capsule   Cholecalciferol (VITAMIN D3) 50 MCG (2000 UT) TABS   Coenzyme Q10 (CO Q 10) 10 MG CAPS   esomeprazole (NEXIUM) 40 MG capsule   fluticasone (FLONASE) 50 MCG/ACT nasal spray   Magnesium Bisglycinate (MAG GLYCINATE PO)   metoprolol  succinate (TOPROL -XL) 25 MG 24 hr tablet   rosuvastatin (CRESTOR) 5 MG tablet   valsartan (DIOVAN) 80 MG tablet   No current facility-administered medications for this encounter.     Harlene Hoots Ward, PA-C [facility] Pre-Surgical Testing [phone]

## 2023-12-10 NOTE — H&P (Signed)
 PROVIDER: Ashritha Desrosiers Phillip SPINNER, MD   Chief Complaint: New Consultation (Right inguinal hernia)  History of Present Illness:  Patient is self-referred. Patient had undergone a previous left inguinal hernia repair with mesh approximately 2-1/2 to 3 years ago. Patient did well. Over the past 2 to 3 months the patient has begun noting discomfort in the right groin. Sometimes this is sharp in nature. He has noted a visible bulge which is enlarging. He has had no signs of obstruction. He has had no recurrent symptoms on the left side. Patient presents today to discuss right inguinal hernia repair in the near future.  Patient does have a history of atrial fibrillation. He remains on chronic anticoagulation with Eliquis. 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 He is accompanied today by his wife.  Review of Systems: A complete review of systems was obtained from the patient. I have reviewed this information and discussed as appropriate with the patient. See HPI as well for other ROS.  Review of Systems  Constitutional: Negative.  HENT: Negative.  Eyes: Negative.  Respiratory: Negative.  Cardiovascular: Positive for palpitations.  Gastrointestinal: Negative.  Genitourinary: Negative.  Musculoskeletal:  Right groin pain  Skin: Negative.  Neurological: Negative.  Endo/Heme/Allergies: Negative.  Psychiatric/Behavioral: Negative.    Medical History: Past Medical History:  Diagnosis Date  Arrhythmia  GERD (gastroesophageal reflux disease) 2000  Hyperlipidemia 04/08/2020  Hypertension 2000  Osteoarthritis 2008   Patient Active Problem List  Diagnosis  Atherosclerosis of aorta ()  Essential hypertension  Gastro-esophageal reflux disease without esophagitis  Mixed hyperlipidemia  Mitral valve prolapse  Palpitations  Paroxysmal atrial fibrillation (CMS/HHS-HCC)  Screening for ischemic heart disease (IHD)  Left inguinal hernia  Status post left inguinal  hernia repair  Inguinal hernia, right   Past Surgical History:  Procedure Laterality Date  CHOLECYSTECTOMY 2004  CARDIAC CATHETERIZATION  HERNIA REPAIR  KNEE ARTHROSCOPY [phone]    Allergies  Allergen Reactions  Other Other (See Comments)  Seasonal: Sneezing and congestion   Current Outpatient Medications on File Prior to Visit  Medication Sig Dispense Refill  ALPRAZolam (XANAX) 0.25 MG tablet Take 0.25 mg by mouth 2 (two) times daily as needed  apixaban (ELIQUIS) 5 mg tablet Take 1 tablet (5 mg total) by mouth every 12 (twelve) hours 180 tablet 3  celecoxib (CELEBREX) 200 MG capsule  cholecalciferol (VITAMIN D3) 2,000 unit capsule Take 2,000 Units by mouth once daily  esomeprazole (NEXIUM) 40 MG DR capsule TK 1 C PO 5 DAYS A WK  metoprolol  succinate (TOPROL -XL) 25 MG XL tablet Take 25 mg by mouth once daily  rosuvastatin (CRESTOR) 5 MG tablet Take 5 mg by mouth once daily  valsartan (DIOVAN) 80 MG tablet Take 80 mg by mouth at bedtime  omega-3 acid ethyl esters (LOVAZA) 1 gram capsule Take 1 g by mouth 2 (two) times daily   No current facility-administered medications on file prior to visit.   Family History  Problem Relation Age of Onset  High blood pressure (Hypertension) Mother  Depression Mother  Pulmonary fibrosis Mother  Heart disease Father  High blood pressure (Hypertension) Father  Coronary Artery Disease (Blocked arteries around heart) Father  Myocardial Infarction (Heart attack) Father  Atrial fibrillation (Abnormal heart rhythm sometimes requiring treatment with blood thinners) Father  Atrial fibrillation (Abnormal heart rhythm sometimes requiring treatment with blood thinners) Sister    Social History   Tobacco Use  Smoking Status Never  Passive exposure: Past  Smokeless Tobacco Never  Social History   Socioeconomic History  Marital status: Married  Number of children: 2  Highest education level: Master's degree (e.g., MA, MS, MEng, MEd,  MSW, MBA)  Occupational History  Occupation: Optometrist  Comment: Works part-time  Tobacco Use  Smoking status: Never  Passive exposure: Past  Smokeless tobacco: Never  Vaping Use  Vaping status: Never Used  Substance and Sexual Activity  Alcohol use: Not Currently  Drug use: Never  Sexual activity: Yes  Partners: Female  Social History Narrative  Grew up in [place], [place]. 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 Has continued to the present time. 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 Married 2 children.   Social Drivers of Corporate Investment Banker Strain: Low Risk (04/09/2020)  Overall Financial Resource Strain (CARDIA)  Difficulty of Paying Living Expenses: Not hard at all  Food Insecurity: No Food Insecurity (10/27/2023)  Hunger Vital Sign  Worried About Running Out of Food in the Last Year: Never true  Ran Out of Food in the Last Year: Never true  Transportation Needs: No Transportation Needs (10/27/2023)  PRAPARE - Risk Analyst (Medical): No  Lack of Transportation (Non-Medical): No  Physical Activity: Sufficiently Active (04/09/2020)  Exercise Vital Sign  Days of Exercise per Week: 4 days  Minutes of Exercise per Session: 60 min  Stress: No Stress Concern Present (04/09/2020)  [employer] of Occupational Health - Occupational Stress Questionnaire  Feeling of Stress : Only a little  Social Connections: Unknown (04/09/2020)  Social Connection and Isolation Panel  Frequency of Communication with Friends and Family: More than three times a week  Attends Banker Meetings: More than 4 times per year  Marital Status: Married  Housing Stability: Unknown (02/03/2023)  Housing Stability Vital Sign  Homeless in the Last Year: No   Objective:   Physical Exam   GENERAL APPEARANCE Comfortable, no acute issues Development: normal Gross deformities: none  SKIN Rash, lesions,  ulcers: none Induration, erythema: none Nodules: none palpable  EYES Conjunctiva and lids: normal Pupils: equal  EARS, NOSE, MOUTH, THROAT External ears: no lesion or deformity External nose: no lesion or deformity Hearing: grossly normal  CHEST/CV Not assessed  ABDOMEN Soft without distention. No sign of umbilical hernia.  GENITOURINARY/RECTAL Normal male genitalia without mass or lesion. Left inguinal incision is well-healed. Palpation in the left inguinal canal with cough and Valsalva shows no sign of recurrence. There is a visible bulge in the right groin. On palpation there is a moderate right inguinal hernia. This is at least partially reducible. It is mildly tender to manipulation.  MUSCULOSKELETAL Station and gait: normal Digits and nails: no clubbing or cyanosis Muscle strength: grossly normal all extremities Deformity: none  PSYCHIATRIC Oriented to person, place, and time: yes Mood and affect: normal for situation Judgment and insight: appropriate for situation   Assessment and Plan:   Inguinal hernia, right  Patient returns to my practice with a enlarging and mildly symptomatic right inguinal hernia. Patient had undergone successful repair of his left inguinal hernia approximately 2-1/2 to 3 years ago.  On examination the patient does have a moderate-sized right inguinal hernia which is at least partially reducible. Today we discussed proceeding with operative repair. I have recommended open repair with mesh, the same technique which we use for the left side. We discussed the risk and benefits of surgery. We discussed the risk of recurrence. We discussed restrictions  on his activities after surgery. We discussed time out of work. The patient understands and wishes to proceed in the near future.  We will contact the patient's cardiologist for cardiology clearance prior to surgery. We discussed perioperative management of his Eliquis. The patient understands and  wishes to proceed.  Krystal Spinner, MD [facility] Surgery A [facility] practice Office: [phone]

## 2023-12-10 NOTE — Anesthesia Preprocedure Evaluation (Addendum)
 Anesthesia Evaluation    Airway Mallampati: II  TM Distance: >3 FB Neck ROM: Full    Dental  (+) Teeth Intact, Dental Advisory Given, Caps   Pulmonary former smoker   Pulmonary exam normal breath sounds clear to auscultation       Cardiovascular hypertension, Pt. on medications Normal cardiovascular exam+ dysrhythmias Atrial Fibrillation  Rhythm:Regular Rate:Normal     Neuro/Psych  PSYCHIATRIC DISORDERS Anxiety     negative neurological ROS     GI/Hepatic Neg liver ROS,GERD  Medicated,,  Endo/Other  HLD  Renal/GU Renal diseaseHx/o nephrolithiasis  negative genitourinary   Musculoskeletal  (+) Arthritis , Osteoarthritis,  Right inguinal hernia Hx/o herniated lumbar disc   Abdominal   Peds  Hematology Eliquis therapy- last dose 11/13   Anesthesia Other Findings   Reproductive/Obstetrics                              Anesthesia Physical Anesthesia Plan  ASA: 2  Anesthesia Plan: General   Post-op Pain Management: Dilaudid  IV, Precedex and Ofirmev  IV (intra-op)*   Induction: Intravenous  PONV Risk Score and Plan: 4 or greater and Treatment may vary due to age or medical condition, Ondansetron  and Dexamethasone   Airway Management Planned: Oral ETT  Additional Equipment: None  Intra-op Plan:   Post-operative Plan: Extubation in OR  Informed Consent: I have reviewed the patients History and Physical, chart, labs and discussed the procedure including the risks, benefits and alternatives for the proposed anesthesia with the patient or authorized representative who has indicated his/her understanding and acceptance.     Dental advisory given  Plan Discussed with: CRNA and Anesthesiologist  Anesthesia Plan Comments: (See PAT note 12/10/2023)         Anesthesia Quick Evaluation

## 2023-12-13 ENCOUNTER — Other Ambulatory Visit: Payer: Self-pay

## 2023-12-13 ENCOUNTER — Encounter (HOSPITAL_COMMUNITY): Payer: Self-pay | Admitting: Surgery

## 2023-12-13 ENCOUNTER — Ambulatory Visit (HOSPITAL_COMMUNITY): Admitting: Anesthesiology

## 2023-12-13 ENCOUNTER — Ambulatory Visit (HOSPITAL_COMMUNITY): Payer: Self-pay | Admitting: Physician Assistant

## 2023-12-13 ENCOUNTER — Encounter (HOSPITAL_COMMUNITY)
Admission: RE | Disposition: A | Discharge status: Home / Self Care | Payer: Self-pay | Source: Home / Self Care | Attending: Surgery

## 2023-12-13 ENCOUNTER — Ambulatory Visit (HOSPITAL_COMMUNITY)
Admission: RE | Admit: 2023-12-13 | Discharge: 2023-12-13 | Disposition: A | Discharge status: Home / Self Care | Payer: Self-pay | Attending: Surgery | Admitting: Surgery

## 2023-12-13 DIAGNOSIS — E782 Mixed hyperlipidemia: Secondary | ICD-10-CM | POA: Insufficient documentation

## 2023-12-13 DIAGNOSIS — Z79899 Other long term (current) drug therapy: Secondary | ICD-10-CM | POA: Diagnosis not present

## 2023-12-13 DIAGNOSIS — Z7901 Long term (current) use of anticoagulants: Secondary | ICD-10-CM | POA: Insufficient documentation

## 2023-12-13 DIAGNOSIS — I480 Paroxysmal atrial fibrillation: Secondary | ICD-10-CM | POA: Insufficient documentation

## 2023-12-13 DIAGNOSIS — I10 Essential (primary) hypertension: Secondary | ICD-10-CM | POA: Diagnosis not present

## 2023-12-13 DIAGNOSIS — F419 Anxiety disorder, unspecified: Secondary | ICD-10-CM | POA: Insufficient documentation

## 2023-12-13 DIAGNOSIS — M1990 Unspecified osteoarthritis, unspecified site: Secondary | ICD-10-CM | POA: Insufficient documentation

## 2023-12-13 DIAGNOSIS — K219 Gastro-esophageal reflux disease without esophagitis: Secondary | ICD-10-CM | POA: Diagnosis not present

## 2023-12-13 DIAGNOSIS — E785 Hyperlipidemia, unspecified: Secondary | ICD-10-CM | POA: Insufficient documentation

## 2023-12-13 DIAGNOSIS — Z87891 Personal history of nicotine dependence: Secondary | ICD-10-CM | POA: Insufficient documentation

## 2023-12-13 DIAGNOSIS — K4090 Unilateral inguinal hernia, without obstruction or gangrene, not specified as recurrent: Secondary | ICD-10-CM

## 2023-12-13 DIAGNOSIS — I341 Nonrheumatic mitral (valve) prolapse: Secondary | ICD-10-CM | POA: Insufficient documentation

## 2023-12-13 HISTORY — PX: INGUINAL HERNIA REPAIR: SHX194

## 2023-12-13 SURGERY — REPAIR, HERNIA, INGUINAL, ADULT
Anesthesia: General | Site: Inguinal | Laterality: Right

## 2023-12-13 MED ORDER — EPHEDRINE SULFATE (PRESSORS) 25 MG/5ML IV SOSY
PREFILLED_SYRINGE | INTRAVENOUS | Status: DC | PRN
  Administered 2023-12-13 (×3): 5 mg via INTRAVENOUS

## 2023-12-13 MED ORDER — ONDANSETRON HCL 4 MG/2ML IJ SOLN
INTRAMUSCULAR | Status: AC
  Filled 2023-12-13: qty 2

## 2023-12-13 MED ORDER — FENTANYL CITRATE (PF) 100 MCG/2ML IJ SOLN
INTRAMUSCULAR | Status: AC
  Filled 2023-12-13: qty 2

## 2023-12-13 MED ORDER — 0.9 % SODIUM CHLORIDE (POUR BTL) OPTIME
TOPICAL | Status: DC | PRN
  Administered 2023-12-13: 1000 mL

## 2023-12-13 MED ORDER — BUPIVACAINE LIPOSOME 1.3 % IJ SUSP
INTRAMUSCULAR | Status: AC
Start: 2023-12-13 — End: 2023-12-13
  Filled 2023-12-13: qty 20

## 2023-12-13 MED ORDER — ONDANSETRON HCL 4 MG/2ML IJ SOLN
INTRAMUSCULAR | Status: DC | PRN
  Administered 2023-12-13: 4 mg via INTRAVENOUS

## 2023-12-13 MED ORDER — ROCURONIUM BROMIDE 10 MG/ML (PF) SYRINGE
PREFILLED_SYRINGE | INTRAVENOUS | Status: DC | PRN
Start: 2023-12-13 — End: 2023-12-13
  Administered 2023-12-13: 70 mg via INTRAVENOUS

## 2023-12-13 MED ORDER — CHLORHEXIDINE GLUCONATE 0.12 % MT SOLN
15.0000 mL | Freq: Once | OROMUCOSAL | Status: AC
  Administered 2023-12-13: 15 mL via OROMUCOSAL

## 2023-12-13 MED ORDER — AMISULPRIDE (ANTIEMETIC) 5 MG/2ML IV SOLN: 10.0000 mg | Freq: Once | INTRAVENOUS | Status: DC | PRN

## 2023-12-13 MED ORDER — HYDROMORPHONE HCL 1 MG/ML IJ SOLN
INTRAMUSCULAR | Status: AC
  Filled 2023-12-13: qty 1

## 2023-12-13 MED ORDER — LIDOCAINE HCL (PF) 2 % IJ SOLN
INTRAMUSCULAR | Status: AC
  Filled 2023-12-13: qty 5

## 2023-12-13 MED ORDER — LACTATED RINGERS IV SOLN: INTRAVENOUS | Status: DC

## 2023-12-13 MED ORDER — FENTANYL CITRATE (PF) 100 MCG/2ML IJ SOLN
INTRAMUSCULAR | Status: DC | PRN
  Administered 2023-12-13: 100 ug via INTRAVENOUS
  Administered 2023-12-13: 50 ug via INTRAVENOUS

## 2023-12-13 MED ORDER — ORAL CARE MOUTH RINSE: 15.0000 mL | Freq: Once | OROMUCOSAL | Status: AC

## 2023-12-13 MED ORDER — CEFAZOLIN SODIUM-DEXTROSE 2-4 GM/100ML-% IV SOLN
2.0000 g | INTRAVENOUS | Status: AC
  Administered 2023-12-13: 2 g via INTRAVENOUS
  Filled 2023-12-13: qty 100

## 2023-12-13 MED ORDER — ROCURONIUM BROMIDE 10 MG/ML (PF) SYRINGE
PREFILLED_SYRINGE | INTRAVENOUS | Status: AC
  Filled 2023-12-13: qty 10

## 2023-12-13 MED ORDER — PROPOFOL 10 MG/ML IV BOLUS
INTRAVENOUS | Status: DC | PRN
Start: 2023-12-13 — End: 2023-12-13
  Administered 2023-12-13: 40 mg via INTRAVENOUS
  Administered 2023-12-13: 150 mg via INTRAVENOUS

## 2023-12-13 MED ORDER — PROPOFOL 10 MG/ML IV BOLUS
INTRAVENOUS | Status: AC
  Filled 2023-12-13: qty 20

## 2023-12-13 MED ORDER — HYDROMORPHONE HCL 1 MG/ML IJ SOLN
0.2500 mg | INTRAMUSCULAR | Status: DC | PRN
  Administered 2023-12-13 (×2): 0.5 mg via INTRAVENOUS

## 2023-12-13 MED ORDER — OXYCODONE HCL 5 MG PO TABS: 5.0000 mg | ORAL_TABLET | Freq: Once | ORAL | Status: DC | PRN

## 2023-12-13 MED ORDER — DEXAMETHASONE SOD PHOSPHATE PF 10 MG/ML IJ SOLN
INTRAMUSCULAR | Status: DC | PRN
Start: 2023-12-13 — End: 2023-12-13
  Administered 2023-12-13: 10 mg via INTRAVENOUS

## 2023-12-13 MED ORDER — SUGAMMADEX SODIUM 200 MG/2ML IV SOLN
INTRAVENOUS | Status: AC
  Filled 2023-12-13: qty 2

## 2023-12-13 MED ORDER — OXYCODONE HCL 5 MG/5ML PO SOLN: 5.0000 mg | Freq: Once | ORAL | Status: DC | PRN

## 2023-12-13 MED ORDER — SUGAMMADEX SODIUM 200 MG/2ML IV SOLN
INTRAVENOUS | Status: DC | PRN
  Administered 2023-12-13: 200 mg via INTRAVENOUS

## 2023-12-13 MED ORDER — OXYCODONE HCL 5 MG PO TABS: 5.0000 mg | ORAL_TABLET | ORAL | 0 refills | Status: AC | PRN

## 2023-12-13 MED ORDER — EPHEDRINE 5 MG/ML INJ
INTRAVENOUS | Status: AC
  Filled 2023-12-13: qty 5

## 2023-12-13 MED ORDER — LIDOCAINE HCL (CARDIAC) PF 100 MG/5ML IV SOSY
PREFILLED_SYRINGE | INTRAVENOUS | Status: DC | PRN
Start: 2023-12-13 — End: 2023-12-13
  Administered 2023-12-13: 100 mg via INTRAVENOUS

## 2023-12-13 MED ORDER — BUPIVACAINE HCL (PF) 0.25 % IJ SOLN
INTRAMUSCULAR | Status: AC
Start: 2023-12-13 — End: 2023-12-13
  Filled 2023-12-13: qty 30

## 2023-12-13 MED ORDER — CHLORHEXIDINE GLUCONATE CLOTH 2 % EX PADS: 6.0000 | MEDICATED_PAD | Freq: Once | CUTANEOUS | Status: DC

## 2023-12-13 MED ORDER — ONDANSETRON HCL 4 MG/2ML IJ SOLN: 4.0000 mg | Freq: Once | INTRAMUSCULAR | Status: DC | PRN

## 2023-12-13 MED ORDER — BUPIVACAINE LIPOSOME 1.3 % IJ SUSP
INTRAMUSCULAR | Status: DC | PRN
  Administered 2023-12-13: 40 mL

## 2023-12-13 SURGICAL SUPPLY — 33 items
BAG COUNTER SPONGE SURGICOUNT (BAG) ×2 IMPLANT
BLADE SURG 15 STRL LF DISP TIS (BLADE) ×2 IMPLANT
CHLORAPREP W/TINT 26 (MISCELLANEOUS) ×4 IMPLANT
COVER SURGICAL LIGHT HANDLE (MISCELLANEOUS) ×2 IMPLANT
DERMABOND ADVANCED .7 DNX12 (GAUZE/BANDAGES/DRESSINGS) IMPLANT
DRAIN PENROSE 0.5X18 (DRAIN) ×2 IMPLANT
DRAPE LAPAROTOMY TRNSV 102X78 (DRAPES) ×2 IMPLANT
DRAPE UTILITY XL STRL (DRAPES) ×2 IMPLANT
ELECT REM PT RETURN 15FT ADLT (MISCELLANEOUS) ×2 IMPLANT
GLOVE BIOGEL PI IND STRL 7.0 (GLOVE) ×2 IMPLANT
GLOVE SURG ORTHO 8.0 STRL STRW (GLOVE) ×2 IMPLANT
GOWN STRL REUS W/ TWL XL LVL3 (GOWN DISPOSABLE) ×4 IMPLANT
KIT BASIN OR (CUSTOM PROCEDURE TRAY) ×2 IMPLANT
KIT TURNOVER KIT A (KITS) ×2 IMPLANT
MARKER SKIN DUAL TIP RULER LAB (MISCELLANEOUS) ×2 IMPLANT
MESH ULTRAPRO 3X6 7.6X15CM (Mesh General) IMPLANT
NDL HYPO 25X1 1.5 SAFETY (NEEDLE) ×2 IMPLANT
NEEDLE HYPO 25X1 1.5 SAFETY (NEEDLE) ×1 IMPLANT
NS IRRIG 1000ML POUR BTL (IV SOLUTION) ×2 IMPLANT
PACK BASIC VI WITH GOWN DISP (CUSTOM PROCEDURE TRAY) ×2 IMPLANT
PENCIL SMOKE EVACUATOR (MISCELLANEOUS) ×2 IMPLANT
SPIKE FLUID TRANSFER (MISCELLANEOUS) ×2 IMPLANT
SPONGE T-LAP 4X18 ~~LOC~~+RFID (SPONGE) ×2 IMPLANT
STRIP CLOSURE SKIN 1/2X4 (GAUZE/BANDAGES/DRESSINGS) ×2 IMPLANT
SUT MNCRL AB 4-0 PS2 18 (SUTURE) ×2 IMPLANT
SUT NOVA NAB GS-21 0 18 T12 DT (SUTURE) IMPLANT
SUT NOVA NAB GS-22 2 0 T19 (SUTURE) ×4 IMPLANT
SUT SILK 2 0 SH (SUTURE) IMPLANT
SUT VIC AB 3-0 SH 18 (SUTURE) ×2 IMPLANT
SYR BULB IRRIG 60ML STRL (SYRINGE) ×2 IMPLANT
SYR CONTROL 10ML LL (SYRINGE) ×2 IMPLANT
TOWEL OR DSP ST BLU DLX 10/PK (DISPOSABLE) ×2 IMPLANT
YANKAUER SUCT BULB TIP 10FT TU (MISCELLANEOUS) IMPLANT

## 2023-12-13 NOTE — Interval H&P Note (Signed)
 History and Physical Interval Note:  12/13/2023 3:25 PM  Phillip [name]  has presented today for surgery, with the diagnosis of RIGHT INGUINAL HERNIA.  The various methods of treatment have been discussed with the patient and family. After consideration of risks, benefits and other options for treatment, the patient has consented to    Procedure(s) with comments: REPAIR, HERNIA, INGUINAL, ADULT (Right) - OPEN REPAIR RIGHT INGUINAL HERNIA WITH MESH as a surgical intervention.    The patient's history has been reviewed, patient examined, no change in status, stable for surgery.  I have reviewed the patient's chart and labs.  Questions were answered to the patient's satisfaction.    Krystal Spinner, MD [facility] practice Office: [phone]   Krystal Spinner

## 2023-12-13 NOTE — Anesthesia Postprocedure Evaluation (Signed)
 Anesthesia Post Note  Patient: Phillip [name]  Procedure(s) Performed: REPAIR, HERNIA, INGUINAL, ADULT (Right: Inguinal)     Patient location during evaluation: PACU Anesthesia Type: General Level of consciousness: awake and alert and oriented Pain management: pain level controlled Vital Signs Assessment: post-procedure vital signs reviewed and stable Respiratory status: spontaneous breathing, nonlabored ventilation and respiratory function stable Cardiovascular status: blood pressure returned to baseline and stable Postop Assessment: no apparent nausea or vomiting Anesthetic complications: no   No notable events documented.  Last Vitals:  Vitals:   12/13/23 1745 12/13/23 1800  BP: 131/81 119/76  Pulse: 60 (!) 57  Resp: 11 13  Temp:    SpO2: 96% 93%    Last Pain:  Vitals:   12/13/23 1804  TempSrc:   PainSc: 3                  Maryama Kuriakose A.

## 2023-12-13 NOTE — Op Note (Signed)
 Procedure Note  Pre-operative Diagnosis:  right inguinal hernia, reducible  Post-operative Diagnosis: same  Procedure:  Open right inguinal hernia repair with mesh  Surgeon:  Krystal Spinner, MD  Anesthesia:  General  Preparation:  Chlora-prep  Estimated Blood Loss: minimal  Complications:  none  Indications: The patient presented with a right, reducible hernia.  Patient is self-referred. Patient had undergone a previous left inguinal hernia repair with mesh approximately 2-1/2 to 3 years ago. Patient did well. Over the past 2 to 3 months the patient has begun noting discomfort in the right groin. Sometimes this is sharp in nature. He has noted a visible bulge which is enlarging. He has had no signs of obstruction. He has had no recurrent symptoms on the left side. Patient presents today to discuss right inguinal hernia repair in the near future.   Procedure Details  The patient was evaluated in the holding area. All of the patient's questions were answered and the proposed procedure was confirmed. The site of the procedure was properly marked. The patient was taken to the Operating Room, identified by name, and the procedure verified as inguinal hernia repair.  The patient was placed in the supine position and underwent induction of anesthesia. A Time Out was performed per routine. The lower abdomen and groin were prepped and draped in the usual aseptic fashion.  After ascertaining that an adequate level of anesthesia had been obtained, an incision was made in the groin with a #10 blade.  Dissection was carried through the subcutaneous tissues and hemostasis obtained with the electrocautery.  A Gelpi retractor was placed for exposure.  The external oblique fascia was incised in line with it's fibers and extended through the external inguinal ring.  The cord structures were dissected out of the inguinal canal and encircled with a Penrose drain.  The floor of the inguinal canal was dissected out.   There was significant laxity of the floor without a fascial defect.  The cord was explored and a moderately large indirect sac was identified.  It was dissected out and a high ligation performed with a 2-0 silk suture ligature.  The sac was excised and discarded.  The floor of the inguinal canal was reconstructed with Ethicon Ultrapro mesh cut to the appropriate dimensions.  It was secured to the pubic tubercle with a 2-0 Novafil suture and along the inguinal ligament with a running 2-0 Novafil suture.  Mesh was split to accommodate the cord structures.  The superior margin of the mesh was secured to the transversalis and internal oblique musculature with interrupted 2-0 Novafil sutures.  The tails of the mesh were overlapped lateral to the cord structures and secured to the inguinal ligament with interrupted 2-0 Novafil sutures to recreate the internal inguinal ring.  Cord structures were returned to the inguinal canal.  Local anesthetic using a mixture of Exparel  and Marcaine  was infiltrated throughout the field.  External oblique fascia was closed with interrupted 3-0 Vicryl sutures.  Subcutaneous tissues were closed with interrupted 3-0 Vicryl sutures.  Skin was anesthetized with local anesthetic using a mixture of Exparel  and Marcaine , and the skin edges were re-approximated with a running 4-0 Monocryl suture.  Wound was washed and dried and Dermabond was applied.  Instrument, sponge, and needle counts were correct prior to closure and at the conclusion of the case.  The patient tolerated the procedure well.  The patient was awakened from anesthesia and brought to the recovery room in stable condition.  Krystal Spinner, MD [facility] Office: [phone]

## 2023-12-13 NOTE — Anesthesia Procedure Notes (Signed)
 Procedure Name: Intubation Date/Time: 12/13/2023 4:02 PM  Performed by: Therisa Doyal CROME, CRNAPre-anesthesia Checklist: Patient identified, Emergency Drugs available, Suction available and Patient being monitored Patient Re-evaluated:Patient Re-evaluated prior to induction Oxygen Delivery Method: Circle system utilized Preoxygenation: Pre-oxygenation with 100% oxygen Induction Type: IV induction Ventilation: Mask ventilation without difficulty and Oral airway inserted - appropriate to patient size Laryngoscope Size: Miller and 3 Grade View: Grade II Tube type: Oral Tube size: 8.0 mm Number of attempts: 1 Airway Equipment and Method: Stylet Placement Confirmation: ETT inserted through vocal cords under direct vision, positive ETCO2 and breath sounds checked- equal and bilateral Secured at: 22 cm Tube secured with: Tape Dental Injury: Teeth and Oropharynx as per pre-operative assessment

## 2023-12-13 NOTE — Discharge Instructions (Addendum)
[facility]  HERNIA REPAIR POST OP INSTRUCTIONS  Always review your discharge instruction sheet given to you by the facility where your surgery was performed.  A  prescription for pain medication may be sent to your pharmacy on discharge.  Take your pain medication as prescribed.  If narcotic pain medicine is not needed, then you may take acetaminophen (Tylenol) or ibuprofen (Advil) as needed.  Take your usually prescribed medications unless otherwise directed.  If you need a refill on your pain medication, please contact your pharmacy.  They will contact our office to request authorization. Prescriptions will not be filled after 5:00 PM daily or on weekends.  You should follow a light diet the first 24 hours after arrival home, such as soup and crackers or toast.  Be sure to include plenty of fluids daily.  Resume your normal diet the day after surgery.  Most patients will experience some swelling and bruising around the surgical site.  Ice packs and reclining will help.  Swelling and bruising can take several days to resolve.   It is common to experience some constipation if taking pain medication after surgery.  Increasing fluid intake and taking a stool softener (such as Colace) will usually help or prevent this problem from occurring.  A mild laxative (Milk of Magnesia or Miralax) should be taken according to package directions if there is no bowel movement after 48 hours.  You will likely have Dermabond (topical glue) over your incisions.  This seals the incisions and allows you to bathe and shower at any time after your surgery.  Glue should remain in place for up to 10 days.  It may be removed after 10 days by pealing off the Dermabond material or using Vaseline or naval jelly to remove.  ACTIVITIES:  You may resume regular (light) daily activities beginning the next day - such as daily self-care, walking, climbing stairs - gradually increasing activities as tolerated.  You  may have sexual intercourse when it is comfortable.  Refrain from any heavy lifting or straining until approved by your doctor.  You may drive when you are no longer taking prescription pain medication, when you can comfortably wear a seatbelt, and when you can safely maneuver your car and apply the brakes.  You should see your doctor in the office for a follow-up appointment approximately 2-3 weeks after your surgery.  Make sure that you call for this appointment within a day or two after you arrive home to insure a convenient appointment time.  WHEN TO CALL YOUR DOCTOR: Fever greater than 101.0 Inability to urinate Persistent nausea and/or vomiting Extreme swelling or bruising Continued bleeding from incision Increased pain, redness, or drainage from the incision  The clinic staff is available to answer your questions during regular business hours.  Please don't hesitate to call and ask to speak to one of the nurses for clinical concerns.  If you have a medical emergency, go to the nearest emergency room or call 911.  A surgeon from [facility] is always on call for the hospital.   [facility] [address], [address], [place], [place]  [address]  [phone] ? [phone] ? FAX [phone] 

## 2023-12-13 NOTE — Transfer of Care (Signed)
 Immediate Anesthesia Transfer of Care Note  Patient: Phillip [name]  Procedure(s) Performed: REPAIR, HERNIA, INGUINAL, ADULT (Right: Inguinal)  Patient Location: PACU  Anesthesia Type:General  Level of Consciousness: awake and alert   Airway & Oxygen Therapy: Patient Spontanous Breathing and Patient connected to face mask oxygen  Post-op Assessment: Report given to RN and Post -op Vital signs reviewed and stable  Post vital signs: Reviewed and stable  Last Vitals:  Vitals Value Taken Time  BP 151/98 12/13/23 17:22  Temp    Pulse 68 12/13/23 17:24  Resp 14 12/13/23 17:24  SpO2 98 % 12/13/23 17:24  Vitals shown include unfiled device data.  Last Pain:  Vitals:   12/13/23 1319  TempSrc:   PainSc: 0-No pain      Patients Stated Pain Goal: 4 (12/13/23 1319)  Complications: No notable events documented.

## 2023-12-14 ENCOUNTER — Encounter (HOSPITAL_COMMUNITY): Payer: Self-pay | Admitting: Surgery
